# Patient Record
Sex: Male | Born: 1935 | Race: Black or African American | Hispanic: No | Marital: Married | Smoking: Former smoker
Health system: Southern US, Community
[De-identification: ages and names within clinical notes are randomized; demographics above are authoritative.]

## PROBLEM LIST (undated history)

## (undated) DIAGNOSIS — E785 Hyperlipidemia, unspecified: Secondary | ICD-10-CM

## (undated) DIAGNOSIS — M1990 Unspecified osteoarthritis, unspecified site: Secondary | ICD-10-CM

## (undated) DIAGNOSIS — C61 Malignant neoplasm of prostate: Secondary | ICD-10-CM

## (undated) DIAGNOSIS — Z973 Presence of spectacles and contact lenses: Secondary | ICD-10-CM

## (undated) DIAGNOSIS — I10 Essential (primary) hypertension: Secondary | ICD-10-CM

## (undated) DIAGNOSIS — Z9189 Other specified personal risk factors, not elsewhere classified: Secondary | ICD-10-CM

## (undated) DIAGNOSIS — D509 Iron deficiency anemia, unspecified: Secondary | ICD-10-CM

## (undated) DIAGNOSIS — C221 Intrahepatic bile duct carcinoma: Secondary | ICD-10-CM

## (undated) DIAGNOSIS — E119 Type 2 diabetes mellitus without complications: Secondary | ICD-10-CM

## (undated) DIAGNOSIS — K219 Gastro-esophageal reflux disease without esophagitis: Secondary | ICD-10-CM

## (undated) DIAGNOSIS — R7303 Prediabetes: Secondary | ICD-10-CM

## (undated) HISTORY — PX: APPENDECTOMY: SHX54

## (undated) HISTORY — DX: Intrahepatic bile duct carcinoma: C22.1

## (undated) HISTORY — DX: Malignant neoplasm of prostate: C61

## (undated) HISTORY — DX: Iron deficiency anemia, unspecified: D50.9

---

## 2007-06-01 HISTORY — PX: COLONOSCOPY W/ BIOPSIES: SHX1374

## 2011-07-13 DIAGNOSIS — E119 Type 2 diabetes mellitus without complications: Secondary | ICD-10-CM | POA: Diagnosis not present

## 2011-11-01 DIAGNOSIS — E1110 Type 2 diabetes mellitus with ketoacidosis without coma: Secondary | ICD-10-CM | POA: Diagnosis not present

## 2011-11-01 DIAGNOSIS — F528 Other sexual dysfunction not due to a substance or known physiological condition: Secondary | ICD-10-CM | POA: Diagnosis not present

## 2011-11-01 DIAGNOSIS — E782 Mixed hyperlipidemia: Secondary | ICD-10-CM | POA: Diagnosis not present

## 2011-11-01 DIAGNOSIS — E119 Type 2 diabetes mellitus without complications: Secondary | ICD-10-CM | POA: Diagnosis not present

## 2011-11-01 DIAGNOSIS — I10 Essential (primary) hypertension: Secondary | ICD-10-CM | POA: Diagnosis not present

## 2011-11-01 DIAGNOSIS — Z79899 Other long term (current) drug therapy: Secondary | ICD-10-CM | POA: Diagnosis not present

## 2011-11-01 DIAGNOSIS — E7800 Pure hypercholesterolemia, unspecified: Secondary | ICD-10-CM | POA: Diagnosis not present

## 2012-03-31 DIAGNOSIS — I10 Essential (primary) hypertension: Secondary | ICD-10-CM | POA: Diagnosis not present

## 2012-03-31 DIAGNOSIS — Z125 Encounter for screening for malignant neoplasm of prostate: Secondary | ICD-10-CM | POA: Diagnosis not present

## 2012-03-31 DIAGNOSIS — E7800 Pure hypercholesterolemia, unspecified: Secondary | ICD-10-CM | POA: Diagnosis not present

## 2012-03-31 DIAGNOSIS — Z23 Encounter for immunization: Secondary | ICD-10-CM | POA: Diagnosis not present

## 2012-03-31 DIAGNOSIS — E119 Type 2 diabetes mellitus without complications: Secondary | ICD-10-CM | POA: Diagnosis not present

## 2012-04-06 DIAGNOSIS — E1139 Type 2 diabetes mellitus with other diabetic ophthalmic complication: Secondary | ICD-10-CM | POA: Diagnosis not present

## 2012-07-28 DIAGNOSIS — E119 Type 2 diabetes mellitus without complications: Secondary | ICD-10-CM | POA: Diagnosis not present

## 2012-07-28 DIAGNOSIS — I10 Essential (primary) hypertension: Secondary | ICD-10-CM | POA: Diagnosis not present

## 2012-07-28 DIAGNOSIS — E781 Pure hyperglyceridemia: Secondary | ICD-10-CM | POA: Diagnosis not present

## 2012-07-28 DIAGNOSIS — E7800 Pure hypercholesterolemia, unspecified: Secondary | ICD-10-CM | POA: Diagnosis not present

## 2012-11-28 DIAGNOSIS — I10 Essential (primary) hypertension: Secondary | ICD-10-CM | POA: Diagnosis not present

## 2012-11-28 DIAGNOSIS — E785 Hyperlipidemia, unspecified: Secondary | ICD-10-CM | POA: Diagnosis not present

## 2012-11-28 DIAGNOSIS — E781 Pure hyperglyceridemia: Secondary | ICD-10-CM | POA: Diagnosis not present

## 2012-11-28 DIAGNOSIS — E119 Type 2 diabetes mellitus without complications: Secondary | ICD-10-CM | POA: Diagnosis not present

## 2012-12-14 DIAGNOSIS — E876 Hypokalemia: Secondary | ICD-10-CM | POA: Diagnosis not present

## 2012-12-19 DIAGNOSIS — Z0000 Encounter for general adult medical examination without abnormal findings: Secondary | ICD-10-CM | POA: Diagnosis not present

## 2013-03-28 DIAGNOSIS — I10 Essential (primary) hypertension: Secondary | ICD-10-CM | POA: Diagnosis not present

## 2013-03-28 DIAGNOSIS — E119 Type 2 diabetes mellitus without complications: Secondary | ICD-10-CM | POA: Diagnosis not present

## 2013-03-28 DIAGNOSIS — R972 Elevated prostate specific antigen [PSA]: Secondary | ICD-10-CM | POA: Diagnosis not present

## 2013-03-28 DIAGNOSIS — Z125 Encounter for screening for malignant neoplasm of prostate: Secondary | ICD-10-CM | POA: Diagnosis not present

## 2013-04-11 DIAGNOSIS — I10 Essential (primary) hypertension: Secondary | ICD-10-CM | POA: Diagnosis not present

## 2013-08-07 DIAGNOSIS — E781 Pure hyperglyceridemia: Secondary | ICD-10-CM | POA: Diagnosis not present

## 2013-08-07 DIAGNOSIS — E7800 Pure hypercholesterolemia, unspecified: Secondary | ICD-10-CM | POA: Diagnosis not present

## 2013-08-07 DIAGNOSIS — I10 Essential (primary) hypertension: Secondary | ICD-10-CM | POA: Diagnosis not present

## 2013-08-07 DIAGNOSIS — E119 Type 2 diabetes mellitus without complications: Secondary | ICD-10-CM | POA: Diagnosis not present

## 2013-09-25 DIAGNOSIS — N402 Nodular prostate without lower urinary tract symptoms: Secondary | ICD-10-CM | POA: Diagnosis not present

## 2013-09-25 DIAGNOSIS — R972 Elevated prostate specific antigen [PSA]: Secondary | ICD-10-CM | POA: Diagnosis not present

## 2013-11-05 DIAGNOSIS — C61 Malignant neoplasm of prostate: Secondary | ICD-10-CM | POA: Diagnosis not present

## 2013-11-05 DIAGNOSIS — R972 Elevated prostate specific antigen [PSA]: Secondary | ICD-10-CM | POA: Diagnosis not present

## 2013-11-05 DIAGNOSIS — D075 Carcinoma in situ of prostate: Secondary | ICD-10-CM | POA: Diagnosis not present

## 2013-11-05 HISTORY — PX: PROSTATE BIOPSY: SHX241

## 2013-11-13 DIAGNOSIS — C61 Malignant neoplasm of prostate: Secondary | ICD-10-CM | POA: Diagnosis not present

## 2013-11-27 ENCOUNTER — Encounter: Payer: Self-pay | Admitting: Radiation Oncology

## 2013-11-27 NOTE — Progress Notes (Signed)
GU Location of Tumor / Histology: prostate adenocarcinoma  If Prostate Cancer, Gleason Score is (3 + 3) and PSA is (3.91 on 08/08/13)  Bruce Duarte presented 2 months ago with signs/symptoms of: rising PSA  Biopsies of prostate (if applicable) revealed:  07/04/56   Past/Anticipated interventions by urology, if any: biopsy volume=42.34cc  Past/Anticipated interventions by medical oncology, if any: none  Weight changes, if any: none  Bowel/Bladder complaints, if any: no  Regular bowel movements with  stool softener daily, no bladder difficulty's,no dysuria,hematuria,   Nausea/Vomiting, if any: no  Pain issues, if any:  None no  SAFETY ISSUES:  Prior radiation? no  Pacemaker/ICD? no  Possible current pregnancy? na  Is the patient on methotrexate? no  Current Complaints / other details:  Married, retired, cousin w/prostate cancer Interested in seed implant.

## 2013-12-04 ENCOUNTER — Ambulatory Visit
Admission: RE | Admit: 2013-12-04 | Discharge: 2013-12-04 | Disposition: A | Discharge status: Home / Self Care | Payer: Medicare Other | Source: Ambulatory Visit | Attending: Radiation Oncology | Admitting: Radiation Oncology

## 2013-12-04 ENCOUNTER — Encounter: Payer: Self-pay | Admitting: Radiation Oncology

## 2013-12-04 VITALS — BP 158/68 | HR 87 | Temp 97.8°F | Resp 20 | Ht 69.0 in | Wt 221.1 lb

## 2013-12-04 DIAGNOSIS — Z510 Encounter for antineoplastic radiation therapy: Secondary | ICD-10-CM | POA: Diagnosis not present

## 2013-12-04 DIAGNOSIS — E7800 Pure hypercholesterolemia, unspecified: Secondary | ICD-10-CM | POA: Insufficient documentation

## 2013-12-04 DIAGNOSIS — C61 Malignant neoplasm of prostate: Secondary | ICD-10-CM | POA: Insufficient documentation

## 2013-12-04 DIAGNOSIS — Z87891 Personal history of nicotine dependence: Secondary | ICD-10-CM | POA: Insufficient documentation

## 2013-12-04 DIAGNOSIS — N529 Male erectile dysfunction, unspecified: Secondary | ICD-10-CM | POA: Insufficient documentation

## 2013-12-04 DIAGNOSIS — E119 Type 2 diabetes mellitus without complications: Secondary | ICD-10-CM | POA: Insufficient documentation

## 2013-12-04 DIAGNOSIS — I10 Essential (primary) hypertension: Secondary | ICD-10-CM | POA: Insufficient documentation

## 2013-12-04 DIAGNOSIS — Z7982 Long term (current) use of aspirin: Secondary | ICD-10-CM | POA: Diagnosis not present

## 2013-12-04 HISTORY — DX: Type 2 diabetes mellitus without complications: E11.9

## 2013-12-04 HISTORY — DX: Essential (primary) hypertension: I10

## 2013-12-04 HISTORY — DX: Malignant neoplasm of prostate: C61

## 2013-12-04 NOTE — Progress Notes (Signed)
[facility] NEW PATIENT EVALUATION  Name: Bruce Duarte MRN: [id number]  Date:   12/04/2013           DOB: May 28, 1936  Status: outpatient   CC:   Arvil Persons, MD Dr. [name]   REFERRING PHYSICIAN: Janice Norrie Charlene Brooke, MD   DIAGNOSIS: Stage TI C. favorable risk adenocarcinoma prostate   HISTORY OF PRESENT ILLNESS:  Bruce Duarte is a 78 y.o. male who is seen today through the courtesy of Dr. Janice Norrie for evaluation of his stage TI C. favorable risk adenocarcinoma prostate. He was found to have a PSA of 2.75 back in November 2013. A more recent PSA by Dr. Criss Rosales on 08/08/2013 was 3.91. He was seen by Dr. Janice Norrie who performed ultrasound-guided biopsies on 11/05/2013 and he was found have Gleason 6 (3+3) involving 30% of one core from right lateral apex and 20% of one core from the right apex. His gland volume was 42.3 cc. He is doing well from a GU and GI standpoint. His I PSS score is 0. He does have erectile dysfunction.  PREVIOUS RADIATION THERAPY: No   PAST MEDICAL HISTORY:  has a past medical history of Prostate cancer (11/05/13); Diabetes mellitus without complication; Hypertension; Hypercholesteremia; and Irregular heart rhythm.     PAST SURGICAL HISTORY:  Past Surgical History  Procedure Laterality Date  . Prostate biopsy  11/05/13    adenocarcinoma, gleason 6  . Appendectomy       FAMILY HISTORY: family history includes Cancer in his cousin; Diabetes in his mother; Stroke in his father. His father died of a stroke at 74. His mother died from "brain cancer" at 66. Family history remarkable for prostate cancer in a distant cousin.   SOCIAL HISTORY:  reports that he quit smoking about 40 years ago. His smoking use included Cigarettes, Pipe, and Cigars. He has a 25 pack-year smoking history. He has never used smokeless tobacco. He reports that he drinks alcohol. He reports that he does not use illicit drugs. 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 3  of 5 children are living.   ALLERGIES: Review of patient's allergies indicates no known allergies.   MEDICATIONS:  Current Outpatient Prescriptions  Medication Sig Dispense Refill  . ALOE VERA JUICE LIQD Take 120 mLs by mouth daily.      Marland Kitchen aspirin 81 MG tablet Take 81 mg by mouth daily.      Mariane Baumgarten Calcium (STOOL SOFTENER PO) Take by mouth.      . ezetimibe (ZETIA) 10 MG tablet Take 10 mg by mouth daily.      Marland Kitchen losartan-hydrochlorothiazide (HYZAAR) 100-25 MG per tablet Take 1 tablet by mouth daily.      . metFORMIN (GLUCOPHAGE) 1000 MG tablet Take 1,000 mg by mouth 2 (two) times daily with a meal.      . Misc Natural Products (GINSENG COMPLEX PO) Take 2 capsules by mouth daily.      . Multiple Vitamins-Minerals (MULTIVITAMIN GUMMIES MENS PO) Take 1 tablet by mouth daily.       No current facility-administered medications for this encounter.     REVIEW OF SYSTEMS:  Pertinent items are noted in HPI.    PHYSICAL EXAM:  height is 5\' 9"  (1.753 m) and weight is 221 lb 1.6 oz (100.29 kg). His oral temperature is 97.8 F (36.6 C). His blood pressure is 158/68 and his pulse is 87. His respiration is 20.   Alert and  oriented. Head and neck examination: Grossly unremarkable. Nodes: Without palpable cervical or supraclavicular lymphadenopathy. Chest: Lungs clear. Back: Without spinal or CVA tenderness. Abdomen: Without masses organomegaly. Genitalia: Unremarkable to inspection. Rectal: The prostate gland is normal size it is without focal induration or nodularity. Extremities: Without edema.   LABORATORY DATA:  No results found for this basename: WBC, HGB, HCT, MCV, PLT   No results found for this basename: NA, K, CL, CO2   No results found for this basename: ALT, AST, GGT, ALKPHOS, BILITOT   PSA 3.91 from 08/08/2013   IMPRESSION: Stage TI C. favorable risk adenocarcinoma prostate. I explained to the patient and his wife that his prognosis is related to his stage, PSA level, and  Gleason score. All are favorable. Management options include surgery versus close surveillance/observation, vs. radiation therapy. Radiation therapy options include seed implantation alone or 8 weeks of external beam/IMRT. We discussed the potential acute and late toxicities of radiation therapy. We also spent a good time discussing close surveillance/observation, which I think would be a good choice for him considering his age. We discussed the process of close surveillance/observation. I explained to the patient and his wife that he is more likely to die from a cardiac event, than from prostate cancer. He indicated that he is very interested in seed implantation, and really wants to consider treating his disease at this time. I told him that the next step would be for [place] to obtain a CT arch study. He is willing to go home and review the literature that he is given today, and then call me if he wants to proceed with seed implantation.   PLAN: As discussed above.  I spent 45  minutes face to face with the patient and more than 50% of that time was spent in counseling and/or coordination of care.

## 2013-12-04 NOTE — Progress Notes (Signed)
Please see the Nurse Progress Note in the MD Initial Consult Encounter for this patient. 

## 2013-12-05 DIAGNOSIS — C61 Malignant neoplasm of prostate: Secondary | ICD-10-CM | POA: Diagnosis not present

## 2013-12-05 DIAGNOSIS — I10 Essential (primary) hypertension: Secondary | ICD-10-CM | POA: Diagnosis not present

## 2013-12-05 DIAGNOSIS — E119 Type 2 diabetes mellitus without complications: Secondary | ICD-10-CM | POA: Diagnosis not present

## 2013-12-05 DIAGNOSIS — E781 Pure hyperglyceridemia: Secondary | ICD-10-CM | POA: Diagnosis not present

## 2013-12-05 DIAGNOSIS — E7800 Pure hypercholesterolemia, unspecified: Secondary | ICD-10-CM | POA: Diagnosis not present

## 2013-12-10 ENCOUNTER — Encounter: Payer: Self-pay | Admitting: Radiation Oncology

## 2013-12-10 NOTE — Progress Notes (Signed)
CC: Dr. Lowella Bandy  Mr. Cuffe called me today and he wants to proceed with seed implantation. We will have him visit this week for his CT arch study and then get him scheduled for seed implantation with Dr. Janice Norrie.

## 2013-12-12 ENCOUNTER — Telehealth: Payer: Self-pay | Admitting: *Deleted

## 2013-12-12 ENCOUNTER — Ambulatory Visit
Admission: RE | Admit: 2013-12-12 | Discharge: 2013-12-12 | Disposition: A | Discharge status: Home / Self Care | Payer: Medicare Other | Source: Ambulatory Visit | Attending: Radiation Oncology | Admitting: Radiation Oncology

## 2013-12-12 DIAGNOSIS — I10 Essential (primary) hypertension: Secondary | ICD-10-CM | POA: Diagnosis not present

## 2013-12-12 DIAGNOSIS — Z510 Encounter for antineoplastic radiation therapy: Secondary | ICD-10-CM | POA: Diagnosis not present

## 2013-12-12 DIAGNOSIS — C61 Malignant neoplasm of prostate: Secondary | ICD-10-CM | POA: Diagnosis not present

## 2013-12-12 DIAGNOSIS — N529 Male erectile dysfunction, unspecified: Secondary | ICD-10-CM | POA: Diagnosis not present

## 2013-12-12 DIAGNOSIS — E119 Type 2 diabetes mellitus without complications: Secondary | ICD-10-CM | POA: Diagnosis not present

## 2013-12-12 DIAGNOSIS — E7800 Pure hypercholesterolemia, unspecified: Secondary | ICD-10-CM | POA: Diagnosis not present

## 2013-12-12 NOTE — Telephone Encounter (Signed)
Called patient and lvm for a return call 

## 2013-12-12 NOTE — Progress Notes (Signed)
CC: Dr. Lowella Bandy    Complex simulation/treatment planning note: The patient was taken to the CT simulator. His pelvis was scanned. The CT data set was sent to the planning system I contoured his prostate. The prostate was projected over the pubic arch. His prostate volume measured to be 39.0 cc. Dr. Janice Norrie measured a volume of 42.3 cc, a good correlation. His arch is open. He is a candidate for seed implantation. I prescribing 14,500 cGy utilizing I-125 seeds. He will be implanted with the [employer] system.

## 2013-12-13 ENCOUNTER — Telehealth: Payer: Self-pay | Admitting: *Deleted

## 2013-12-13 ENCOUNTER — Other Ambulatory Visit: Payer: Self-pay | Admitting: Urology

## 2013-12-13 NOTE — Telephone Encounter (Signed)
CALLED PATIENT TO INFORM OF IMPLANT DATE, SPOKE WITH PATIENT AND HE IS AWARE OF THIS DATE 

## 2013-12-14 ENCOUNTER — Telehealth: Payer: Self-pay | Admitting: *Deleted

## 2013-12-14 NOTE — Telephone Encounter (Signed)
CALLED PATIENT TO REMIND TO GET CHEST X-RAY AND EKG, LVM FOR A RETURN CALL

## 2013-12-17 ENCOUNTER — Ambulatory Visit (HOSPITAL_BASED_OUTPATIENT_CLINIC_OR_DEPARTMENT_OTHER)
Admission: RE | Admit: 2013-12-17 | Discharge: 2013-12-17 | Disposition: A | Discharge status: Home / Self Care | Payer: Medicare Other | Source: Ambulatory Visit | Attending: Urology | Admitting: Urology

## 2013-12-17 ENCOUNTER — Encounter (HOSPITAL_BASED_OUTPATIENT_CLINIC_OR_DEPARTMENT_OTHER)
Admission: RE | Admit: 2013-12-17 | Discharge: 2013-12-17 | Disposition: A | Discharge status: Home / Self Care | Payer: Medicare Other | Source: Ambulatory Visit | Attending: Urology | Admitting: Urology

## 2013-12-17 ENCOUNTER — Other Ambulatory Visit: Payer: Self-pay

## 2013-12-17 DIAGNOSIS — E119 Type 2 diabetes mellitus without complications: Secondary | ICD-10-CM | POA: Diagnosis not present

## 2013-12-17 DIAGNOSIS — Z01818 Encounter for other preprocedural examination: Secondary | ICD-10-CM | POA: Diagnosis not present

## 2013-12-17 DIAGNOSIS — I700 Atherosclerosis of aorta: Secondary | ICD-10-CM | POA: Diagnosis not present

## 2013-12-17 DIAGNOSIS — I10 Essential (primary) hypertension: Secondary | ICD-10-CM | POA: Diagnosis not present

## 2014-01-21 ENCOUNTER — Telehealth: Payer: Self-pay | Admitting: *Deleted

## 2014-01-21 ENCOUNTER — Encounter (HOSPITAL_BASED_OUTPATIENT_CLINIC_OR_DEPARTMENT_OTHER): Payer: Self-pay | Admitting: *Deleted

## 2014-01-21 NOTE — Telephone Encounter (Signed)
CALLED PATIENT TO REMIND OF LABS FOR 01-22-14, LVM FOR A RETURN CALL

## 2014-01-22 ENCOUNTER — Encounter (HOSPITAL_BASED_OUTPATIENT_CLINIC_OR_DEPARTMENT_OTHER): Payer: Self-pay | Admitting: *Deleted

## 2014-01-22 DIAGNOSIS — M129 Arthropathy, unspecified: Secondary | ICD-10-CM | POA: Diagnosis not present

## 2014-01-22 DIAGNOSIS — E785 Hyperlipidemia, unspecified: Secondary | ICD-10-CM | POA: Diagnosis not present

## 2014-01-22 DIAGNOSIS — Z87891 Personal history of nicotine dependence: Secondary | ICD-10-CM | POA: Diagnosis not present

## 2014-01-22 DIAGNOSIS — Z8042 Family history of malignant neoplasm of prostate: Secondary | ICD-10-CM | POA: Diagnosis not present

## 2014-01-22 DIAGNOSIS — I10 Essential (primary) hypertension: Secondary | ICD-10-CM | POA: Diagnosis not present

## 2014-01-22 DIAGNOSIS — Z9089 Acquired absence of other organs: Secondary | ICD-10-CM | POA: Diagnosis not present

## 2014-01-22 DIAGNOSIS — C61 Malignant neoplasm of prostate: Secondary | ICD-10-CM | POA: Diagnosis not present

## 2014-01-22 DIAGNOSIS — Z79899 Other long term (current) drug therapy: Secondary | ICD-10-CM | POA: Diagnosis not present

## 2014-01-22 LAB — CBC
HCT: 40.2 % (ref 39.0–52.0)
HEMOGLOBIN: 12.8 g/dL — AB (ref 13.0–17.0)
MCH: 24.5 pg — ABNORMAL LOW (ref 26.0–34.0)
MCHC: 31.8 g/dL (ref 30.0–36.0)
MCV: 76.9 fL — ABNORMAL LOW (ref 78.0–100.0)
Platelets: 276 10*3/uL (ref 150–400)
RBC: 5.23 MIL/uL (ref 4.22–5.81)
RDW: 18.6 % — ABNORMAL HIGH (ref 11.5–15.5)
WBC: 3.3 10*3/uL — ABNORMAL LOW (ref 4.0–10.5)

## 2014-01-22 LAB — COMPREHENSIVE METABOLIC PANEL
ALT: 20 U/L (ref 0–53)
AST: 21 U/L (ref 0–37)
Albumin: 3.9 g/dL (ref 3.5–5.2)
Alkaline Phosphatase: 59 U/L (ref 39–117)
Anion gap: 14 (ref 5–15)
BUN: 15 mg/dL (ref 6–23)
CALCIUM: 9.6 mg/dL (ref 8.4–10.5)
CO2: 25 mEq/L (ref 19–32)
Chloride: 102 mEq/L (ref 96–112)
Creatinine, Ser: 0.99 mg/dL (ref 0.50–1.35)
GFR calc non Af Amer: 78 mL/min — ABNORMAL LOW (ref >90)
GLUCOSE: 113 mg/dL — AB (ref 70–99)
Potassium: 3.8 mEq/L (ref 3.7–5.3)
SODIUM: 141 meq/L (ref 137–147)
TOTAL PROTEIN: 7.6 g/dL (ref 6.0–8.3)
Total Bilirubin: 0.3 mg/dL (ref 0.3–1.2)

## 2014-01-22 LAB — PROTIME-INR
INR: 1 (ref 0.00–1.49)
Prothrombin Time: 13.2 seconds (ref 11.6–15.2)

## 2014-01-22 LAB — APTT: APTT: 30 s (ref 24–37)

## 2014-01-22 NOTE — Progress Notes (Signed)
NPO AFTER MN. ARRIVE AT 0830. CURRENT LAB RESULTS, EKG AND CXR IN CHART AND EPIC. WILL DO FLEET ENEMA AM DOS.

## 2014-01-22 NOTE — Progress Notes (Signed)
01/22/14 1526  OBSTRUCTIVE SLEEP APNEA  Have you ever been diagnosed with sleep apnea through a sleep study? No  Do you snore loudly (loud enough to be heard through closed doors)?  1  Do you often feel tired, fatigued, or sleepy during the daytime? 0  Has anyone observed you stop breathing during your sleep? 0  Do you have, or are you being treated for high blood pressure? 1  BMI more than 35 kg/m2? 0  Age over 76 years old? 1  Neck circumference greater than 40 cm/16 inches? 1  Gender: 1  Obstructive Sleep Apnea Score 5  Score 4 or greater  Results sent to PCP

## 2014-01-28 ENCOUNTER — Telehealth: Payer: Self-pay | Admitting: *Deleted

## 2014-01-28 DIAGNOSIS — C61 Malignant neoplasm of prostate: Secondary | ICD-10-CM | POA: Diagnosis not present

## 2014-01-28 NOTE — Telephone Encounter (Signed)
CALLED PATIENT TO REMIND OF PROCEDURE FOR 01-29-14, SPOKE WITH PATIENT AND HE IS AWARE OF THIS PROCEDURE

## 2014-01-29 ENCOUNTER — Encounter (HOSPITAL_BASED_OUTPATIENT_CLINIC_OR_DEPARTMENT_OTHER): Payer: Self-pay | Admitting: Certified Registered"

## 2014-01-29 ENCOUNTER — Encounter (HOSPITAL_BASED_OUTPATIENT_CLINIC_OR_DEPARTMENT_OTHER)
Admission: RE | Disposition: A | Discharge status: Home / Self Care | Payer: Self-pay | Source: Ambulatory Visit | Attending: Urology

## 2014-01-29 ENCOUNTER — Encounter: Payer: Self-pay | Admitting: Radiation Oncology

## 2014-01-29 ENCOUNTER — Ambulatory Visit (HOSPITAL_COMMUNITY): Payer: Medicare Other

## 2014-01-29 ENCOUNTER — Ambulatory Visit (HOSPITAL_BASED_OUTPATIENT_CLINIC_OR_DEPARTMENT_OTHER)
Admission: RE | Admit: 2014-01-29 | Discharge: 2014-01-29 | Disposition: A | Discharge status: Home / Self Care | Payer: Medicare Other | Source: Ambulatory Visit | Attending: Urology | Admitting: Urology

## 2014-01-29 ENCOUNTER — Encounter (HOSPITAL_BASED_OUTPATIENT_CLINIC_OR_DEPARTMENT_OTHER): Payer: Medicare Other | Admitting: Anesthesiology

## 2014-01-29 ENCOUNTER — Ambulatory Visit (HOSPITAL_BASED_OUTPATIENT_CLINIC_OR_DEPARTMENT_OTHER): Payer: Medicare Other | Admitting: Anesthesiology

## 2014-01-29 DIAGNOSIS — E785 Hyperlipidemia, unspecified: Secondary | ICD-10-CM | POA: Diagnosis not present

## 2014-01-29 DIAGNOSIS — M129 Arthropathy, unspecified: Secondary | ICD-10-CM | POA: Insufficient documentation

## 2014-01-29 DIAGNOSIS — Z9089 Acquired absence of other organs: Secondary | ICD-10-CM | POA: Insufficient documentation

## 2014-01-29 DIAGNOSIS — C61 Malignant neoplasm of prostate: Secondary | ICD-10-CM | POA: Insufficient documentation

## 2014-01-29 DIAGNOSIS — I10 Essential (primary) hypertension: Secondary | ICD-10-CM | POA: Insufficient documentation

## 2014-01-29 DIAGNOSIS — Z79899 Other long term (current) drug therapy: Secondary | ICD-10-CM | POA: Diagnosis not present

## 2014-01-29 DIAGNOSIS — Z8042 Family history of malignant neoplasm of prostate: Secondary | ICD-10-CM | POA: Insufficient documentation

## 2014-01-29 DIAGNOSIS — Z87891 Personal history of nicotine dependence: Secondary | ICD-10-CM | POA: Insufficient documentation

## 2014-01-29 HISTORY — DX: Unspecified osteoarthritis, unspecified site: M19.90

## 2014-01-29 HISTORY — DX: Presence of spectacles and contact lenses: Z97.3

## 2014-01-29 HISTORY — DX: Hyperlipidemia, unspecified: E78.5

## 2014-01-29 HISTORY — DX: Prediabetes: R73.03

## 2014-01-29 HISTORY — DX: Other specified personal risk factors, not elsewhere classified: Z91.89

## 2014-01-29 HISTORY — PX: RADIOACTIVE SEED IMPLANT: SHX5150

## 2014-01-29 LAB — POCT I-STAT 4, (NA,K, GLUC, HGB,HCT)
Glucose, Bld: 108 mg/dL — ABNORMAL HIGH (ref 70–99)
HCT: 40 % (ref 39.0–52.0)
HEMOGLOBIN: 13.6 g/dL (ref 13.0–17.0)
Potassium: 3.8 mEq/L (ref 3.7–5.3)
Sodium: 140 mEq/L (ref 137–147)

## 2014-01-29 LAB — GLUCOSE, CAPILLARY: GLUCOSE-CAPILLARY: 112 mg/dL — AB (ref 70–99)

## 2014-01-29 SURGERY — INSERTION, RADIATION SOURCE, PROSTATE
Anesthesia: General | Site: Prostate

## 2014-01-29 MED ORDER — HYDROCODONE-ACETAMINOPHEN 5-325 MG PO TABS: 1.0000 | ORAL_TABLET | Freq: Four times a day (QID) | ORAL | Status: DC | PRN

## 2014-01-29 MED ORDER — MEPERIDINE HCL 25 MG/ML IJ SOLN
6.2500 mg | INTRAMUSCULAR | Status: DC | PRN
  Filled 2014-01-29: qty 1

## 2014-01-29 MED ORDER — FENTANYL CITRATE 0.05 MG/ML IJ SOLN
25.0000 ug | INTRAMUSCULAR | Status: DC | PRN
  Filled 2014-01-29: qty 1

## 2014-01-29 MED ORDER — CIPROFLOXACIN IN D5W 400 MG/200ML IV SOLN
400.0000 mg | INTRAVENOUS | Status: AC
  Administered 2014-01-29: 400 mg via INTRAVENOUS
  Filled 2014-01-29: qty 200

## 2014-01-29 MED ORDER — IOHEXOL 350 MG/ML SOLN
INTRAVENOUS | Status: DC | PRN
Start: 2014-01-29 — End: 2014-01-29
  Administered 2014-01-29: 7 mL

## 2014-01-29 MED ORDER — PROMETHAZINE HCL 25 MG/ML IJ SOLN
6.2500 mg | INTRAMUSCULAR | Status: DC | PRN
  Filled 2014-01-29: qty 1

## 2014-01-29 MED ORDER — LIDOCAINE HCL (CARDIAC) 20 MG/ML IV SOLN
INTRAVENOUS | Status: DC | PRN
  Administered 2014-01-29: 60 mg via INTRAVENOUS

## 2014-01-29 MED ORDER — SUCCINYLCHOLINE CHLORIDE 20 MG/ML IJ SOLN
INTRAMUSCULAR | Status: DC | PRN
  Administered 2014-01-29: 100 mg via INTRAVENOUS

## 2014-01-29 MED ORDER — FENTANYL CITRATE 0.05 MG/ML IJ SOLN
INTRAMUSCULAR | Status: DC | PRN
  Administered 2014-01-29 (×3): 25 ug via INTRAVENOUS
  Administered 2014-01-29: 50 ug via INTRAVENOUS
  Administered 2014-01-29: 25 ug via INTRAVENOUS
  Administered 2014-01-29: 50 ug via INTRAVENOUS

## 2014-01-29 MED ORDER — LACTATED RINGERS IV SOLN
INTRAVENOUS | Status: DC
  Administered 2014-01-29 (×2): via INTRAVENOUS
  Filled 2014-01-29: qty 1000

## 2014-01-29 MED ORDER — PROPOFOL 10 MG/ML IV BOLUS
INTRAVENOUS | Status: DC | PRN
  Administered 2014-01-29: 180 mg via INTRAVENOUS

## 2014-01-29 MED ORDER — KETOROLAC TROMETHAMINE 30 MG/ML IJ SOLN
INTRAMUSCULAR | Status: DC | PRN
  Administered 2014-01-29: 30 mg via INTRAVENOUS

## 2014-01-29 MED ORDER — ONDANSETRON HCL 4 MG/2ML IJ SOLN
INTRAMUSCULAR | Status: DC | PRN
  Administered 2014-01-29: 4 mg via INTRAVENOUS

## 2014-01-29 MED ORDER — CIPROFLOXACIN HCL 500 MG PO TABS: 500.0000 mg | ORAL_TABLET | Freq: Two times a day (BID) | ORAL | Status: DC

## 2014-01-29 MED ORDER — STERILE WATER FOR IRRIGATION IR SOLN
Status: DC | PRN
  Administered 2014-01-29: 3000 mL

## 2014-01-29 MED ORDER — FENTANYL CITRATE 0.05 MG/ML IJ SOLN
INTRAMUSCULAR | Status: AC
  Filled 2014-01-29: qty 6

## 2014-01-29 MED ORDER — CIPROFLOXACIN IN D5W 400 MG/200ML IV SOLN
INTRAVENOUS | Status: AC
  Filled 2014-01-29: qty 200

## 2014-01-29 MED ORDER — FLEET ENEMA 7-19 GM/118ML RE ENEM
1.0000 | ENEMA | Freq: Once | RECTAL | Status: DC
  Filled 2014-01-29: qty 1

## 2014-01-29 MED ORDER — DEXAMETHASONE SODIUM PHOSPHATE 4 MG/ML IJ SOLN
INTRAMUSCULAR | Status: DC | PRN
  Administered 2014-01-29: 8 mg via INTRAVENOUS

## 2014-01-29 SURGICAL SUPPLY — 26 items
BAG URINE DRAINAGE (UROLOGICAL SUPPLIES) ×3 IMPLANT
BLADE CLIPPER SURG (BLADE) ×3 IMPLANT
CATH FOLEY 2WAY SLVR  5CC 16FR (CATHETERS) ×4
CATH FOLEY 2WAY SLVR 5CC 16FR (CATHETERS) ×2 IMPLANT
CATH ROBINSON RED A/P 20FR (CATHETERS) ×3 IMPLANT
CLOTH BEACON ORANGE TIMEOUT ST (SAFETY) ×3 IMPLANT
COVER MAYO STAND STRL (DRAPES) ×3 IMPLANT
COVER TABLE BACK 60X90 (DRAPES) ×3 IMPLANT
DRSG TEGADERM 4X4.75 (GAUZE/BANDAGES/DRESSINGS) ×3 IMPLANT
DRSG TEGADERM 8X12 (GAUZE/BANDAGES/DRESSINGS) ×3 IMPLANT
GLOVE BIO SURGEON STRL SZ7 (GLOVE) ×6 IMPLANT
GLOVE BIO SURGEON STRL SZ7.5 (GLOVE) ×10 IMPLANT
GLOVE BIOGEL M 6.5 STRL (GLOVE) ×4 IMPLANT
GLOVE BIOGEL PI IND STRL 6.5 (GLOVE) IMPLANT
GLOVE BIOGEL PI INDICATOR 6.5 (GLOVE) ×4
GLOVE ECLIPSE 8.0 STRL XLNG CF (GLOVE) IMPLANT
GOWN STRL REUS W/TWL LRG LVL3 (GOWN DISPOSABLE) ×4 IMPLANT
GOWN STRL REUS W/TWL XL LVL3 (GOWN DISPOSABLE) ×2 IMPLANT
HOLDER FOLEY CATH W/STRAP (MISCELLANEOUS) ×3 IMPLANT
IV WATER IRRIG STERILE 3000ML (IV SOLUTION) ×3 IMPLANT
PACK CYSTOSCOPY (CUSTOM PROCEDURE TRAY) ×3 IMPLANT
SPONGE GAUZE 4X4 12PLY STER LF (GAUZE/BANDAGES/DRESSINGS) ×2 IMPLANT
SYRINGE 10CC LL (SYRINGE) ×3 IMPLANT
UNDERPAD 30X30 INCONTINENT (UNDERPADS AND DIAPERS) ×6 IMPLANT
WATER STERILE IRR 500ML POUR (IV SOLUTION) ×3 IMPLANT
nucletron selectseed ×144 IMPLANT

## 2014-01-29 NOTE — H&P (Signed)
Urology Admission H&P  Chief Complaint: Prostate cancer  History of Present Illness: Bruce Duarte was found on rectal examination to have a prostate nodule at the right mid gland.  His PSA is 3.91.  Prostate biopsy on 11/05/13  revealed  Gleason 6 prostate cancer.  Treatment options were discussed with the patient.  He is not interested in active surveillance although he was advised to consider it.  He saw Dr Valere Dross in consultation for radiation therapy.  He chose to have seeds implantation.  He is scheduled today for the procedure.  Past Medical History  Diagnosis Date  . Hypertension   . Hyperlipidemia   . Prostate cancer DX  2013    Gleason 3+3=6, vol 42.34 cc  . Borderline type 2 diabetes mellitus   . Arthritis   . Wears glasses   . At risk for sleep apnea     STOP-BANG= 5   SENT TO PCP 01-22-2014   Past Surgical History  Procedure Laterality Date  . Prostate biopsy  11/05/13    adenocarcinoma, gleason 6  . Appendectomy  age 55  . Colonoscopy w/ biopsies  2009    non-cancerous    Home Medications:  Prescriptions prior to admission  Medication Sig Dispense Refill  . ALOE VERA JUICE LIQD Take 120 mLs by mouth daily.      Marland Kitchen aspirin EC 81 MG tablet Take 81 mg by mouth daily.      . Cholecalciferol (VITAMIN D3) 1000 UNITS CAPS Take by mouth.      . docusate sodium (COLACE) 100 MG capsule Take 200 mg by mouth daily.      Marland Kitchen losartan-hydrochlorothiazide (HYZAAR) 100-25 MG per tablet Take 1 tablet by mouth every morning.       . metFORMIN (GLUMETZA) 500 MG (MOD) 24 hr tablet Take 1,500 mg by mouth every evening.      . Misc Natural Products (GINSENG COMPLEX PO) Take 2 capsules by mouth daily.      . Multiple Vitamins-Minerals (MULTIVITAMIN GUMMIES MENS PO) Take 1 tablet by mouth daily.      . vitamin E 400 UNIT capsule Take 400 Units by mouth daily.       Allergies: No Known Allergies  Family History  Problem Relation Age of Onset  . Diabetes Mother   . Stroke Father   . Cancer  Cousin     prostate   Social History:  reports that he quit smoking about 40 years ago. His smoking use included Cigarettes, Pipe, and Cigars. He has a 25 pack-year smoking history. He has never used smokeless tobacco. He reports that he drinks alcohol. He reports that he does not use illicit drugs.  ROS  Physical Exam:  Vital signs in last 24 hours: Temp:  [98 F (36.7 C)] 98 F (36.7 C) (09/01 0828) Pulse Rate:  [74] 74 (09/01 0828) Resp:  [14] 14 (09/01 0828) BP: (142)/(73) 142/73 mmHg (09/01 0828) SpO2:  [96 %] 96 % (09/01 0828) Weight:  [96.163 kg (212 lb)] 96.163 kg (212 lb) (09/01 0454) Physical Exam: HEENT: Normal Lungs: clear Heart: Regular sinus rhythm. Abdomen: Soft, non distended, non tender.  No CVA tenderness. Bowel sounds: normal Genitalia: Penis is normal.  Scrotum is unremarkable.  Testicles are normal. Rectal:  Sphincter tone: normal.  Prostate enlarged 40 gm.There is a prostate nodule at the right mid gland.  Seminal vesicles: not palpable.  Laboratory Data:  No results found for this or any previous visit (from the past 24 hour(s)). No  results found for this or any previous visit (from the past 240 hour(s)). Creatinine:  Recent Labs  01/22/14 0952  CREATININE 0.99     Impression/Assessment:  Adenocarcinoma of prostate Stage T2a  Plan:  I 125 seeds implantation.  Arvil Persons 01/29/2014, 9:11 AM

## 2014-01-29 NOTE — Op Note (Addendum)
Bruce Duarte is a 78 y.o.   01/29/2014  General  Preop diagnosis: Adenocarcinoma of prostate stage T 2a   Postop diagnosis: Same  Procedure done: I-125 seeds implantation, cystoscopy  Surgeons: Charlene Brooke. Aviv Lengacher and Dr. Arloa Koh  Anesthesia: Gen.  Indication: Patient is a 78 years old male who was found on rectal examination to have a prostate nodule at the right mid gland. His PSA is 3.91. Prostate biopsy showed Gleason 6 prostate cancer at the right apex. Treatment options were discussed with the patient. He is not interested in active surveillance. He saw Dr. Valere Dross in consultation. He elected to have seeds implantation. He is scheduled today for procedure.  Procedure: The patient was identified by his wrist band and proper timeout was taken.  Under general anesthesia he was prepped and draped and placed in the dorsolithotomy position. A #16 French Foley catheter was inserted in the bladder. Ultrasound planning was done by Dr. Valere Dross. When planning was completed on ultrasound guidance and with the Nucletron a total of 72 seeds through 27 needles were implanted in the prostate. The total apparent activity is 34.2000 mCi. With fluoroscopy there appears to be good seeds distribution.  The Foley catheter was removed. A flexible cystoscope was inserted in the bladder. The anterior urethra is normal. He has moderate prostatic hypertrophy. The bladder is moderately trabeculated. There is no stone or tumor in the bladder. The ureteral orifices are in normal position and shape with clear efflux. The cystoscope was then removed and.  A #16 French Foley catheter was reinserted in the bladder.  The patient tolerated the procedure well and left the OR in satisfactory condition to postanesthesia care unit.  CC: [name], M.D.

## 2014-01-29 NOTE — Anesthesia Postprocedure Evaluation (Signed)
  Anesthesia Post-op Note  Patient: Bruce Duarte  Procedure(s) Performed: Procedure(s) (LRB): RADIOACTIVE SEED IMPLANT (N/A)  Patient Location: PACU  Anesthesia Type: General  Level of Consciousness: awake and alert   Airway and Oxygen Therapy: Patient Spontanous Breathing  Post-op Pain: mild  Post-op Assessment: Post-op Vital signs reviewed, Patient's Cardiovascular Status Stable, Respiratory Function Stable, Patent Airway and No signs of Nausea or vomiting  Last Vitals:  Filed Vitals:   01/29/14 1235  BP: 163/78  Pulse: 80  Temp:   Resp: 11    Post-op Vital Signs: stable   Complications: No apparent anesthesia complications

## 2014-01-29 NOTE — Anesthesia Preprocedure Evaluation (Addendum)
Anesthesia Evaluation  Patient identified by MRN, date of birth, ID band Patient awake    Reviewed: Allergy & Precautions, H&P , NPO status , Patient's Chart, lab work & pertinent test results  Airway Mallampati: II TM Distance: >3 FB Neck ROM: Full    Dental no notable dental hx.    Pulmonary former smoker,  breath sounds clear to auscultation  Pulmonary exam normal       Cardiovascular hypertension, Pt. on medications Rhythm:Regular Rate:Normal     Neuro/Psych negative neurological ROS  negative psych ROS   GI/Hepatic negative GI ROS, Neg liver ROS,   Endo/Other  diabetes  Renal/GU negative Renal ROS  negative genitourinary   Musculoskeletal negative musculoskeletal ROS (+)   Abdominal   Peds negative pediatric ROS (+)  Hematology negative hematology ROS (+)   Anesthesia Other Findings   Reproductive/Obstetrics negative OB ROS                          Anesthesia Physical Anesthesia Plan  ASA: II  Anesthesia Plan: General   Post-op Pain Management:    Induction: Intravenous  Airway Management Planned: Oral ETT  Additional Equipment:   Intra-op Plan:   Post-operative Plan: Extubation in OR  Informed Consent: I have reviewed the patients History and Physical, chart, labs and discussed the procedure including the risks, benefits and alternatives for the proposed anesthesia with the patient or authorized representative who has indicated his/her understanding and acceptance.   Dental advisory given  Plan Discussed with: CRNA  Anesthesia Plan Comments:         Anesthesia Quick Evaluation

## 2014-01-29 NOTE — Transfer of Care (Signed)
Immediate Anesthesia Transfer of Care Note  Patient: Bruce Duarte  Procedure(s) Performed: Procedure(s) (LRB): RADIOACTIVE SEED IMPLANT (N/A)  Patient Location: PACU  Anesthesia Type: General  Level of Consciousness: awake, oriented, sedated and patient cooperative  Airway & Oxygen Therapy: Patient Spontanous Breathing and Patient connected to face mask oxygen  Post-op Assessment: Report given to PACU RN and Post -op Vital signs reviewed and stable  Post vital signs: Reviewed and stable  Complications: No apparent anesthesia complications

## 2014-01-29 NOTE — Progress Notes (Signed)
CC: Dr. Lowella Bandy  End of treatment summary:  Diagnosis: Stage TI C. favorable risk adenocarcinoma prostate  Requesting physician: Dr. Lowella Bandy  Intent: Curative  Procedure: Low dose rate brachytherapy prostate seed implant  Urologist: Dr. Lowella Bandy  Implant date: 01/29/2014  Site/dose: Prostate 14,500 cGy utilizing I-125 seeds. 25 active needles were used to implant 72 seeds with an individual seed activity of 0.48 mCi per seed for a total implant activity of 34.2 mCi.  Narrative: The patient appears to have undergone a successful Nucletron seed Selectron implant with Dr. Janice Norrie.  Plan: Followup visit with both of [place] in approximately 3 weeks. He'll have a CT scan at that time to assess the quality of his implant.

## 2014-01-29 NOTE — Anesthesia Procedure Notes (Signed)
Procedure Name: Intubation Date/Time: 01/29/2014 10:18 AM Performed by: Denna Haggard D Pre-anesthesia Checklist: Patient identified, Emergency Drugs available, Suction available and Patient being monitored Patient Re-evaluated:Patient Re-evaluated prior to inductionOxygen Delivery Method: Circle System Utilized Preoxygenation: Pre-oxygenation with 100% oxygen Intubation Type: IV induction Ventilation: Mask ventilation without difficulty Laryngoscope Size: Mac and 3 Grade View: Grade II Tube type: Oral Tube size: 8.0 mm Number of attempts: 2 Airway Equipment and Method: stylet and oral airway Placement Confirmation: ETT inserted through vocal cords under direct vision,  positive ETCO2 and breath sounds checked- equal and bilateral Secured at: 22 cm Tube secured with: Tape Dental Injury: Teeth and Oropharynx as per pre-operative assessment

## 2014-01-29 NOTE — Discharge Instructions (Signed)
Please instruct patient how to remove Foley in AM    Radioactive Seed Implant Home Care Instructions   Activity:    Rest for the remainder of the day.  Do not drive or operate equipment today.  You may resume normal  activities in a few days as instructed by your physician, without risk of harmful radiation exposure to those around you, provided you follow the time and distance precautions on the Radiation Oncology Instruction Sheet.   Meals: Drink plenty of lipuids and eat light foods, such as gelatin or soup this evening .  You may return to normal meal plan tomorrow.  Return To Work: You may return to work as instructed by Naval [employer].  Special Instruction:   If any seeds are found, use tweezers to pick up seeds and place in a glass container of any kind and bring to your physician's office.  Call your physician if any of these symptoms occur:   Persistent or heavy bleeding  Urine stream diminishes or stops completely after catheter is removed  Fever equal to or greater than 101 degrees F  Cloudy urine with a strong foul odor  Severe pain  You may feel some burning pain and/or hesitancy when you urinate after the catheter is removed.  These symptoms may increase over the next few weeks, but should diminish within forur to six weeks.  Applying moist heat to the lower abdomen or a hot tub bath may help relieve the pain.  If the discomfort becomes severe, please call your physician for additional medications.  Follow-up (Date of Return Visit to Physician): ***  Patient:_______________________________   @DATE @  Nurse:________________________________ @DATE @    Foley Catheter Care A Foley catheter is a soft, flexible tube that is placed into the bladder to drain urine. A Foley catheter may be inserted if:  You leak urine or are not able to control when you urinate (urinary incontinence).  You are not able to urinate when you need to (urinary retention).  You had  prostate surgery or surgery on the genitals.  You have certain medical conditions, such as multiple sclerosis, dementia, or a spinal cord injury. If you are going home with a Foley catheter in place, follow the instructions below. TAKING CARE OF THE CATHETER 1. Wash your hands with soap and water. 2. Using mild soap and warm water on a clean washcloth:  Clean the area on your body closest to the catheter insertion site using a circular motion, moving away from the catheter. Never wipe toward the catheter because this could sweep bacteria up into the urethra and cause infection.  Remove all traces of soap. Pat the area dry with a clean towel. For males, reposition the foreskin. 3. Attach the catheter to your leg so there is no tension on the catheter. Use adhesive tape or a leg strap. If you are using adhesive tape, remove any sticky residue left behind by the previous tape you used. 4. Keep the drainage bag below the level of the bladder, but keep it off the floor. 5. Check throughout the day to be sure the catheter is working and urine is draining freely. Make sure the tubing does not become kinked. 6. Do not pull on the catheter or try to remove it. Pulling could damage internal tissues. TAKING CARE OF THE DRAINAGE BAGS You will be given two drainage bags to take home. One is a large overnight drainage bag, and the other is a smaller leg bag that fits underneath clothing. You  may wear the overnight bag at any time, but you should never wear the smaller leg bag at night. Follow the instructions below for how to empty, change, and clean your drainage bags. Emptying the Drainage Bag You must empty your drainage bag when it is  - full or at least 2-3 times a day. 1. Wash your hands with soap and water. 2. Keep the drainage bag below your hips, below the level of your bladder. This stops urine from going back into the tubing and into your bladder. 3. Hold the dirty bag over the toilet or a clean  container. 4. Open the pour spout at the bottom of the bag and empty the urine into the toilet or container. Do not let the pour spout touch the toilet, container, or any other surface. Doing so can place bacteria on the bag, which can cause an infection. 5. Clean the pour spout with a gauze pad or cotton ball that has rubbing alcohol on it. 6. Close the pour spout. 7. Attach the bag to your leg with adhesive tape or a leg strap. 8. Wash your hands well. Changing the Drainage Bag Change your drainage bag once a month or sooner if it starts to smell bad or look dirty. Below are steps to follow when changing the drainage bag. 1. Wash your hands with soap and water. 2. Pinch off the rubber catheter so that urine does not spill out. 3. Disconnect the catheter tube from the drainage tube at the connection valve. Do not let the tubes touch any surface. 4. Clean the end of the catheter tube with an alcohol wipe. Use a different alcohol wipe to clean the end of the drainage tube. 5. Connect the catheter tube to the drainage tube of the clean drainage bag. 6. Attach the new bag to the leg with adhesive tape or a leg strap. Avoid attaching the new bag too tightly. 7. Wash your hands well. Cleaning the Drainage Bag 1. Wash your hands with soap and water. 2. Wash the bag in warm, soapy water. 3. Rinse the bag thoroughly with warm water. 4. Fill the bag with a solution of white vinegar and water (1 cup vinegar to 1 qt warm water [.2 L vinegar to 1 L warm water]). Close the bag and soak it for 30 minutes in the solution. 5. Rinse the bag with warm water. 6. Hang the bag to dry with the pour spout open and hanging downward. 7. Store the clean bag (once it is dry) in a clean plastic bag. 8. Wash your hands well. PREVENTING INFECTION  Wash your hands before and after handling your catheter.  Take showers daily and wash the area where the catheter enters your body. Do not take baths. Replace wet leg straps  with dry ones, if this applies.  Do not use powders, sprays, or lotions on the genital area. Only use creams, lotions, or ointments as directed by your caregiver.  For females, wipe from front to back after each bowel movement.  Drink enough fluids to keep your urine clear or pale yellow unless you have a fluid restriction.  Do not let the drainage bag or tubing touch or lie on the floor.  Wear cotton underwear to absorb moisture and to keep your skin drier. SEEK MEDICAL CARE IF:   Your urine is cloudy or smells unusually bad.  Your catheter becomes clogged.  You are not draining urine into the bag or your bladder feels full.  Your catheter starts  to leak. SEEK IMMEDIATE MEDICAL CARE IF:   You have pain, swelling, redness, or pus where the catheter enters the body.  You have pain in the abdomen, legs, lower back, or bladder.  You have a fever.  You see blood fill the catheter, or your urine is pink or red.  You have nausea, vomiting, or chills.  Your catheter gets pulled out. MAKE SURE YOU:   Understand these instructions.  Will watch your condition.  Will get help right away if you are not doing well or get worse. Document Released: 05/17/2005 Document Revised: 10/01/2013 Document Reviewed: 05/08/2012 [facility] Patient Information 2015 [place], [place]. This information is not intended to replace advice given to you by your health care provider. Make sure you discuss any questions you have with your health care provider.     Post Anesthesia Home Care Instructions  Activity: Get plenty of rest for the remainder of the day. A responsible adult should stay with you for 24 hours following the procedure.  For the next 24 hours, DO NOT: -Drive a car -Paediatric nurse -Drink alcoholic beverages -Take any medication unless instructed by your physician -Make any legal decisions or sign important papers.  Meals: Start with liquid foods such as gelatin or soup. Progress  to regular foods as tolerated. Avoid greasy, spicy, heavy foods. If nausea and/or vomiting occur, drink only clear liquids until the nausea and/or vomiting subsides. Call your physician if vomiting continues.  Special Instructions/Symptoms: Your throat may feel dry or sore from the anesthesia or the breathing tube placed in your throat during surgery. If this causes discomfort, gargle with warm salt water. The discomfort should disappear within 24 hours.

## 2014-01-29 NOTE — Progress Notes (Signed)
[facility] Operative Procedure Note  Name: Bruce Duarte MRN: [id number]  Date:   01/29/2014           DOB: February 06, 1936  Status:outpatient    CC: Dr. Lowella Bandy, Dr. [name]  DIAGNOSIS: A 78 year old gentlemen with stage T1 C. adenocarcinoma of the prostate with a Gleason of 6 and a PSA of 3.91.  PROCEDURE: Insertion of radioactive I-125 seeds into the prostate gland.  RADIATION DOSE: 140 Gy, definitive therapy.  TECHNIQUE: [name] was brought to the operating room with Dr. Janice Norrie. He was placed in the dorsolithotomy position. He was catheterized and a rectal tube was inserted. The perineum was shaved, prepped and draped. The ultrasound probe was then introduced into the rectum to see the prostate gland.  TREATMENT DEVICE: A needle grid was attached to the ultrasound probe stand and anchor needles were placed.  COMPLEX ISODOSE CALCULATION: The prostate was imaged in 3D using a sagittal sweep of the prostate probe. These images were transferred to the planning computer. There, the prostate, urethra and rectum were defined on each axial reconstructed image. Then, the software created an optimized plan and a few seed positions were adjusted. Then the accepted plan was uploaded to the seed Selectron afterloading unit.  SPECIAL TREATMENT PROCEDURE/SUPERVISION AND HANDLING: The Nucletron FIRST system was used to place the needles under sagittal guidance. A total of 25 needles were used to deposit 72 seeds in the prostate gland. The individual seed activity was 0.48 mCi for a total implant activity of 34.2 mCi.  COMPLEX SIMULATION: At the end of the procedure, an anterior radiograph of the pelvis was obtained to document seed positioning and count. Cystoscopy was performed to check the urethra and bladder.  MICRODOSIMETRY: At the end of the procedure, the patient was emitting 0.08 mrem/hr at 1 meter. Accordingly, he was considered safe for  hospital discharge.  PLAN: The patient will return to the radiation oncology clinic for post implant CT dosimetry in three weeks.

## 2014-01-30 ENCOUNTER — Encounter (HOSPITAL_BASED_OUTPATIENT_CLINIC_OR_DEPARTMENT_OTHER): Payer: Self-pay | Admitting: Urology

## 2014-02-18 ENCOUNTER — Telehealth: Payer: Self-pay | Admitting: *Deleted

## 2014-02-18 NOTE — Telephone Encounter (Signed)
CALLED PATIENT TO REMIND OF APPTS. FOR 02-19-14, SPOKE WITH PATIENT'S WIFE AND SHE IS AWARE OF THESE APPTS.

## 2014-02-19 ENCOUNTER — Ambulatory Visit
Admission: RE | Admit: 2014-02-19 | Discharge: 2014-02-19 | Disposition: A | Discharge status: Home / Self Care | Payer: Medicare Other | Source: Ambulatory Visit | Attending: Radiation Oncology | Admitting: Radiation Oncology

## 2014-02-19 ENCOUNTER — Encounter: Payer: Self-pay | Admitting: Radiation Oncology

## 2014-02-19 ENCOUNTER — Ambulatory Visit
Admit: 2014-02-19 | Discharge: 2014-02-19 | Disposition: A | Discharge status: Home / Self Care | Payer: PRIVATE HEALTH INSURANCE | Attending: Radiation Oncology | Admitting: Radiation Oncology

## 2014-02-19 VITALS — BP 138/69 | HR 79 | Temp 98.3°F | Resp 20

## 2014-02-19 DIAGNOSIS — Z510 Encounter for antineoplastic radiation therapy: Secondary | ICD-10-CM | POA: Diagnosis not present

## 2014-02-19 DIAGNOSIS — I10 Essential (primary) hypertension: Secondary | ICD-10-CM | POA: Diagnosis not present

## 2014-02-19 DIAGNOSIS — E119 Type 2 diabetes mellitus without complications: Secondary | ICD-10-CM | POA: Diagnosis not present

## 2014-02-19 DIAGNOSIS — C61 Malignant neoplasm of prostate: Secondary | ICD-10-CM

## 2014-02-19 DIAGNOSIS — N529 Male erectile dysfunction, unspecified: Secondary | ICD-10-CM | POA: Diagnosis not present

## 2014-02-19 DIAGNOSIS — E7800 Pure hypercholesterolemia, unspecified: Secondary | ICD-10-CM | POA: Diagnosis not present

## 2014-02-19 DIAGNOSIS — N390 Urinary tract infection, site not specified: Secondary | ICD-10-CM | POA: Diagnosis not present

## 2014-02-19 NOTE — Progress Notes (Signed)
CC: Dr. Lowella Bandy, Dr. [name]  Follow up note:  Bruce Duarte visits today approximately 3 weeks following his prostate seed implant with Bruce Duarte in the management of his stage TI C. favorable risk adenocarcinoma prostate. He is doing well from a GU and GI standpoint. He states that he does have a slow urinary stream in the morning but improves today. He denies dysuria. He is to see Bruce Duarte later today.   His CT scan today for his post implant dosimetry shows an excellent seed distribution.  Physical examination: Alert and oriented. Filed Vitals:   02/19/14 1051  BP: 138/69  Pulse: 79  Temp: 98.3 F (36.8 C)  Resp: 20   Rectal examination not performed today.  Impression: Satisfactory progress with minimal GU symptomatology.  Plan: He'll see Bruce Duarte today and then return to see Bruce Duarte in 2- 3 months for a followup visit and PSA determination. We'll move ahead with his post implant dosimetry and for the results of Bruce Duarte. I've not scheduled the patient for a formal followup visit and I ask that Bruce Duarte keep me posted on his progress

## 2014-02-19 NOTE — Progress Notes (Signed)
Patient denies pain, bowel issues, fatigue, loss of appetite. He states when he wakes in the morning his urinary stream is slow, but as the day goes on it typically returns to normal.

## 2014-02-19 NOTE — Progress Notes (Signed)
Complex simulation note: The patient was taken to the CT simulator. His pelvis was scanned. The CT data set was sent to the planning system for contouring of his prostate and rectum in preparation of 3-D conformal planning to assess the quality of his implant.

## 2014-03-25 ENCOUNTER — Encounter: Payer: Self-pay | Admitting: Radiation Oncology

## 2014-03-25 ENCOUNTER — Ambulatory Visit
Admission: RE | Admit: 2014-03-25 | Discharge: 2014-03-25 | Disposition: A | Discharge status: Home / Self Care | Payer: Medicare Other | Source: Ambulatory Visit | Attending: Radiation Oncology | Admitting: Radiation Oncology

## 2014-03-25 DIAGNOSIS — C61 Malignant neoplasm of prostate: Secondary | ICD-10-CM | POA: Diagnosis not present

## 2014-03-25 NOTE — Progress Notes (Signed)
CC: Dr. Beulah Gandy  Post-implant CT dosimetry note:  The patient completed post-implant CT dosimetry on 03/25/2014. His intraoperative prostate volume by ultrasound was 41.1 mL and his postoperative prostate volume by CT was 46.5 mL, a good correlation. Dose volume histograms were obtained for the rectum and prostate. His prostate D 90 is 111.6% and his V100 is 96.1%, both excellent. Only 0.03 mL of rectum receives the prescribed dose of 14,500 cGy. In summary, he has excellent post-implant dosimetry with a low risk for late rectal toxicity. His prognosis is excellent.

## 2014-04-02 DIAGNOSIS — E119 Type 2 diabetes mellitus without complications: Secondary | ICD-10-CM | POA: Diagnosis not present

## 2014-04-02 DIAGNOSIS — I10 Essential (primary) hypertension: Secondary | ICD-10-CM | POA: Diagnosis not present

## 2014-04-02 DIAGNOSIS — E785 Hyperlipidemia, unspecified: Secondary | ICD-10-CM | POA: Diagnosis not present

## 2014-05-15 DIAGNOSIS — N390 Urinary tract infection, site not specified: Secondary | ICD-10-CM | POA: Diagnosis not present

## 2014-05-15 DIAGNOSIS — C61 Malignant neoplasm of prostate: Secondary | ICD-10-CM | POA: Diagnosis not present

## 2014-05-22 DIAGNOSIS — C61 Malignant neoplasm of prostate: Secondary | ICD-10-CM | POA: Diagnosis not present

## 2014-05-22 DIAGNOSIS — N529 Male erectile dysfunction, unspecified: Secondary | ICD-10-CM | POA: Diagnosis not present

## 2014-05-22 DIAGNOSIS — N139 Obstructive and reflux uropathy, unspecified: Secondary | ICD-10-CM | POA: Diagnosis not present

## 2014-08-06 ENCOUNTER — Ambulatory Visit (HOSPITAL_COMMUNITY)
Admission: RE | Admit: 2014-08-06 | Discharge: 2014-08-06 | Disposition: A | Discharge status: Home / Self Care | Payer: Medicare Other | Source: Ambulatory Visit | Attending: Family Medicine | Admitting: Family Medicine

## 2014-08-06 ENCOUNTER — Other Ambulatory Visit (HOSPITAL_COMMUNITY): Payer: Self-pay | Admitting: Family Medicine

## 2014-08-06 DIAGNOSIS — M1389 Other specified arthritis, multiple sites: Secondary | ICD-10-CM

## 2014-08-06 DIAGNOSIS — M25551 Pain in right hip: Secondary | ICD-10-CM | POA: Diagnosis not present

## 2014-08-06 DIAGNOSIS — E1169 Type 2 diabetes mellitus with other specified complication: Secondary | ICD-10-CM | POA: Diagnosis not present

## 2014-08-06 DIAGNOSIS — R972 Elevated prostate specific antigen [PSA]: Secondary | ICD-10-CM | POA: Diagnosis not present

## 2014-08-06 DIAGNOSIS — I10 Essential (primary) hypertension: Secondary | ICD-10-CM | POA: Diagnosis not present

## 2014-08-06 DIAGNOSIS — E782 Mixed hyperlipidemia: Secondary | ICD-10-CM | POA: Diagnosis not present

## 2014-08-06 DIAGNOSIS — M1611 Unilateral primary osteoarthritis, right hip: Secondary | ICD-10-CM | POA: Diagnosis not present

## 2014-08-06 DIAGNOSIS — Z8546 Personal history of malignant neoplasm of prostate: Secondary | ICD-10-CM | POA: Diagnosis not present

## 2014-08-15 DIAGNOSIS — C61 Malignant neoplasm of prostate: Secondary | ICD-10-CM | POA: Diagnosis not present

## 2014-08-22 DIAGNOSIS — C61 Malignant neoplasm of prostate: Secondary | ICD-10-CM | POA: Diagnosis not present

## 2014-08-22 DIAGNOSIS — N529 Male erectile dysfunction, unspecified: Secondary | ICD-10-CM | POA: Diagnosis not present

## 2014-11-28 DIAGNOSIS — C61 Malignant neoplasm of prostate: Secondary | ICD-10-CM | POA: Diagnosis not present

## 2014-11-29 DIAGNOSIS — I10 Essential (primary) hypertension: Secondary | ICD-10-CM | POA: Diagnosis not present

## 2014-11-29 DIAGNOSIS — E782 Mixed hyperlipidemia: Secondary | ICD-10-CM | POA: Diagnosis not present

## 2014-12-04 ENCOUNTER — Other Ambulatory Visit: Payer: Self-pay | Admitting: Family Medicine

## 2014-12-04 ENCOUNTER — Ambulatory Visit
Admission: RE | Admit: 2014-12-04 | Discharge: 2014-12-04 | Disposition: A | Discharge status: Home / Self Care | Payer: Medicare Other | Source: Ambulatory Visit | Attending: Family Medicine | Admitting: Family Medicine

## 2014-12-04 DIAGNOSIS — R0989 Other specified symptoms and signs involving the circulatory and respiratory systems: Secondary | ICD-10-CM | POA: Diagnosis not present

## 2014-12-04 DIAGNOSIS — E1169 Type 2 diabetes mellitus with other specified complication: Secondary | ICD-10-CM | POA: Diagnosis not present

## 2014-12-04 DIAGNOSIS — I10 Essential (primary) hypertension: Secondary | ICD-10-CM | POA: Diagnosis not present

## 2014-12-05 DIAGNOSIS — N529 Male erectile dysfunction, unspecified: Secondary | ICD-10-CM | POA: Diagnosis not present

## 2014-12-05 DIAGNOSIS — C61 Malignant neoplasm of prostate: Secondary | ICD-10-CM | POA: Diagnosis not present

## 2014-12-11 DIAGNOSIS — R972 Elevated prostate specific antigen [PSA]: Secondary | ICD-10-CM | POA: Diagnosis not present

## 2014-12-11 DIAGNOSIS — E782 Mixed hyperlipidemia: Secondary | ICD-10-CM | POA: Diagnosis not present

## 2014-12-11 DIAGNOSIS — I10 Essential (primary) hypertension: Secondary | ICD-10-CM | POA: Diagnosis not present

## 2014-12-11 DIAGNOSIS — E1169 Type 2 diabetes mellitus with other specified complication: Secondary | ICD-10-CM | POA: Diagnosis not present

## 2015-01-20 DIAGNOSIS — E119 Type 2 diabetes mellitus without complications: Secondary | ICD-10-CM | POA: Diagnosis not present

## 2015-01-20 DIAGNOSIS — I10 Essential (primary) hypertension: Secondary | ICD-10-CM | POA: Diagnosis not present

## 2015-01-20 DIAGNOSIS — Z86010 Personal history of colonic polyps: Secondary | ICD-10-CM | POA: Diagnosis not present

## 2015-01-20 DIAGNOSIS — C61 Malignant neoplasm of prostate: Secondary | ICD-10-CM | POA: Diagnosis not present

## 2015-01-30 HISTORY — PX: COLONOSCOPY: SHX174

## 2015-02-06 DIAGNOSIS — Z0000 Encounter for general adult medical examination without abnormal findings: Secondary | ICD-10-CM | POA: Diagnosis not present

## 2015-02-18 DIAGNOSIS — Z1211 Encounter for screening for malignant neoplasm of colon: Secondary | ICD-10-CM | POA: Diagnosis not present

## 2015-02-18 DIAGNOSIS — Z86010 Personal history of colonic polyps: Secondary | ICD-10-CM | POA: Diagnosis not present

## 2015-02-18 DIAGNOSIS — K5730 Diverticulosis of large intestine without perforation or abscess without bleeding: Secondary | ICD-10-CM | POA: Diagnosis not present

## 2015-02-18 DIAGNOSIS — D128 Benign neoplasm of rectum: Secondary | ICD-10-CM | POA: Diagnosis not present

## 2015-02-18 DIAGNOSIS — K621 Rectal polyp: Secondary | ICD-10-CM | POA: Diagnosis not present

## 2015-03-04 DIAGNOSIS — C61 Malignant neoplasm of prostate: Secondary | ICD-10-CM | POA: Diagnosis not present

## 2015-03-12 ENCOUNTER — Ambulatory Visit (INDEPENDENT_AMBULATORY_CARE_PROVIDER_SITE_OTHER): Payer: Medicare Other | Admitting: Urology

## 2015-03-12 DIAGNOSIS — N139 Obstructive and reflux uropathy, unspecified: Secondary | ICD-10-CM

## 2015-03-12 DIAGNOSIS — R972 Elevated prostate specific antigen [PSA]: Secondary | ICD-10-CM

## 2015-03-12 DIAGNOSIS — N529 Male erectile dysfunction, unspecified: Secondary | ICD-10-CM

## 2015-03-12 DIAGNOSIS — C61 Malignant neoplasm of prostate: Secondary | ICD-10-CM | POA: Diagnosis not present

## 2015-04-16 DIAGNOSIS — I10 Essential (primary) hypertension: Secondary | ICD-10-CM | POA: Diagnosis not present

## 2015-04-16 DIAGNOSIS — E1169 Type 2 diabetes mellitus with other specified complication: Secondary | ICD-10-CM | POA: Diagnosis not present

## 2015-04-16 DIAGNOSIS — Z23 Encounter for immunization: Secondary | ICD-10-CM | POA: Diagnosis not present

## 2015-04-16 DIAGNOSIS — E782 Mixed hyperlipidemia: Secondary | ICD-10-CM | POA: Diagnosis not present

## 2015-06-09 DIAGNOSIS — C61 Malignant neoplasm of prostate: Secondary | ICD-10-CM | POA: Diagnosis not present

## 2015-06-18 ENCOUNTER — Ambulatory Visit (INDEPENDENT_AMBULATORY_CARE_PROVIDER_SITE_OTHER): Payer: Medicare Other | Admitting: Urology

## 2015-06-18 DIAGNOSIS — N390 Urinary tract infection, site not specified: Secondary | ICD-10-CM | POA: Diagnosis not present

## 2015-06-18 DIAGNOSIS — N5201 Erectile dysfunction due to arterial insufficiency: Secondary | ICD-10-CM | POA: Diagnosis not present

## 2015-06-18 DIAGNOSIS — C61 Malignant neoplasm of prostate: Secondary | ICD-10-CM

## 2015-06-18 DIAGNOSIS — N139 Obstructive and reflux uropathy, unspecified: Secondary | ICD-10-CM

## 2015-08-15 DIAGNOSIS — I10 Essential (primary) hypertension: Secondary | ICD-10-CM | POA: Diagnosis not present

## 2015-08-15 DIAGNOSIS — E782 Mixed hyperlipidemia: Secondary | ICD-10-CM | POA: Diagnosis not present

## 2015-08-15 DIAGNOSIS — E1169 Type 2 diabetes mellitus with other specified complication: Secondary | ICD-10-CM | POA: Diagnosis not present

## 2015-08-15 DIAGNOSIS — R0989 Other specified symptoms and signs involving the circulatory and respiratory systems: Secondary | ICD-10-CM | POA: Diagnosis not present

## 2015-08-15 DIAGNOSIS — Z6833 Body mass index (BMI) 33.0-33.9, adult: Secondary | ICD-10-CM | POA: Diagnosis not present

## 2015-09-08 DIAGNOSIS — C61 Malignant neoplasm of prostate: Secondary | ICD-10-CM | POA: Diagnosis not present

## 2015-09-17 ENCOUNTER — Ambulatory Visit: Payer: Medicare Other | Admitting: Urology

## 2015-09-29 DIAGNOSIS — E119 Type 2 diabetes mellitus without complications: Secondary | ICD-10-CM | POA: Diagnosis not present

## 2015-09-29 DIAGNOSIS — H5203 Hypermetropia, bilateral: Secondary | ICD-10-CM | POA: Diagnosis not present

## 2015-09-29 DIAGNOSIS — H52223 Regular astigmatism, bilateral: Secondary | ICD-10-CM | POA: Diagnosis not present

## 2015-09-29 DIAGNOSIS — Z7984 Long term (current) use of oral hypoglycemic drugs: Secondary | ICD-10-CM | POA: Diagnosis not present

## 2015-10-08 ENCOUNTER — Ambulatory Visit (INDEPENDENT_AMBULATORY_CARE_PROVIDER_SITE_OTHER): Payer: Medicare Other | Admitting: Urology

## 2015-10-08 DIAGNOSIS — N5201 Erectile dysfunction due to arterial insufficiency: Secondary | ICD-10-CM | POA: Diagnosis not present

## 2015-10-08 DIAGNOSIS — C61 Malignant neoplasm of prostate: Secondary | ICD-10-CM

## 2015-11-14 DIAGNOSIS — I10 Essential (primary) hypertension: Secondary | ICD-10-CM | POA: Diagnosis not present

## 2015-11-14 DIAGNOSIS — E1169 Type 2 diabetes mellitus with other specified complication: Secondary | ICD-10-CM | POA: Diagnosis not present

## 2015-12-25 IMAGING — CR DG CHEST 2V
2 series · 2 of 2 positions shown · non-contrast
Comparison: None.

CLINICAL DATA: Preoperative respect for exam for prostate surgery.
Hypertension and diabetes.

EXAM:
CHEST  2 VIEW

[w chest pa]
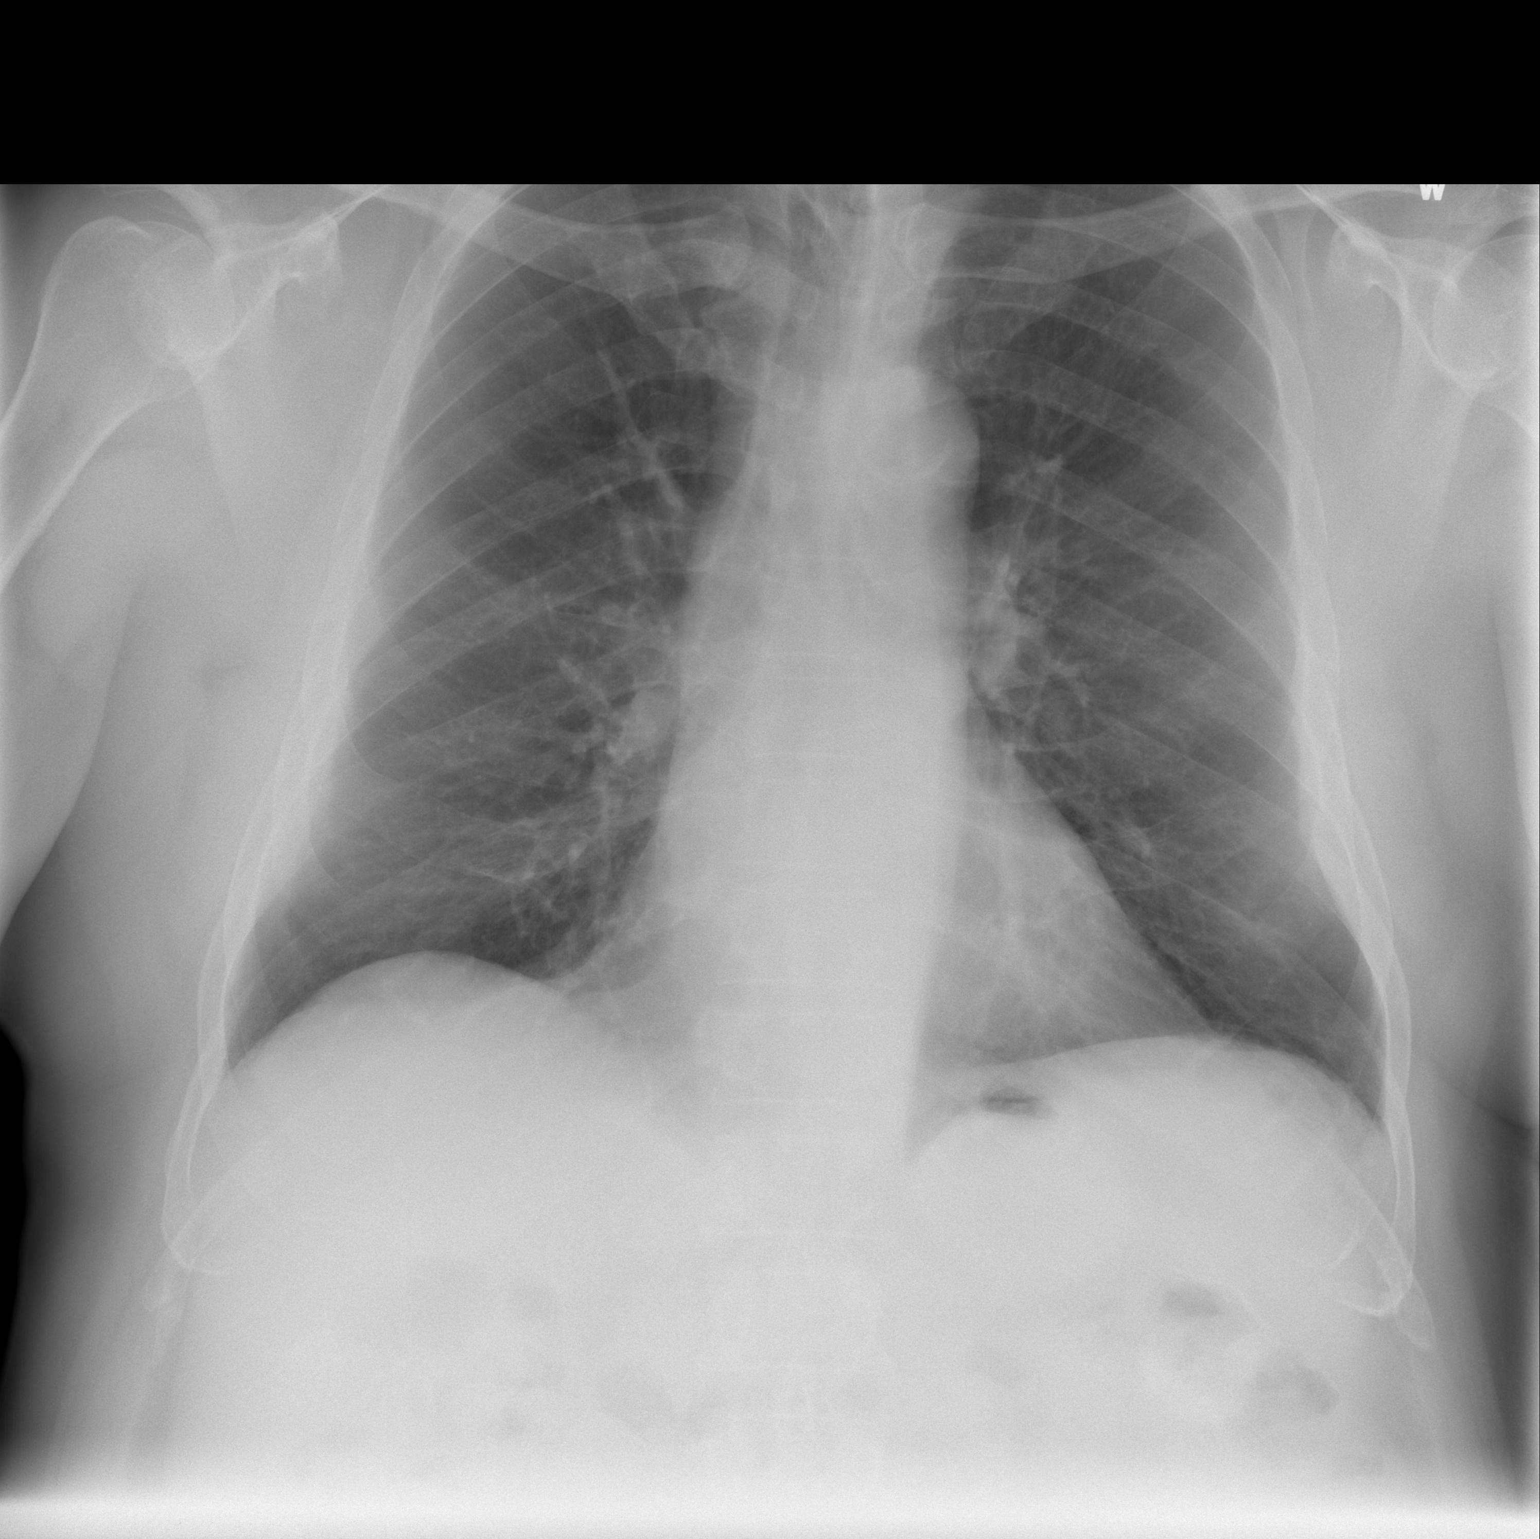

[w chest lat]
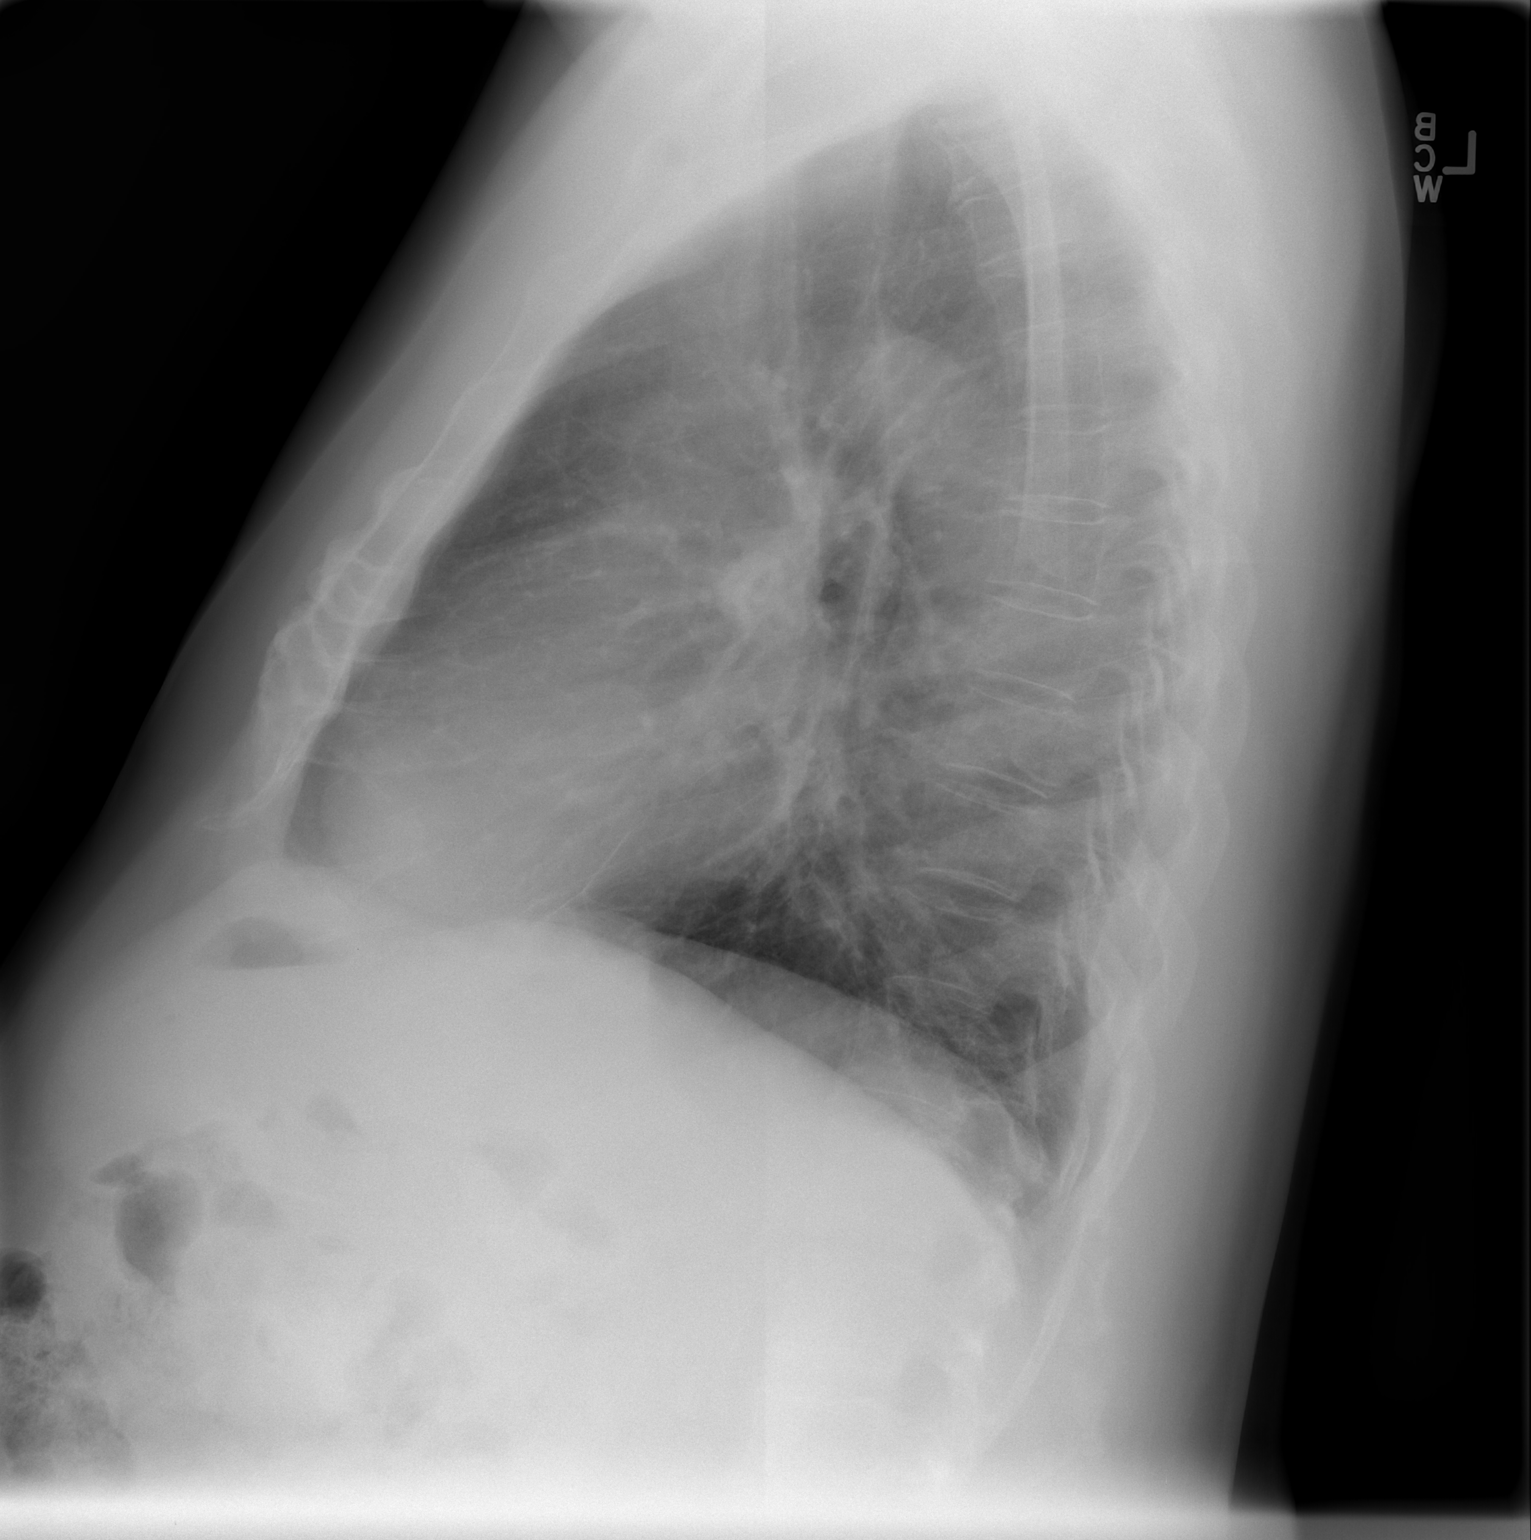

[2 of 2 positions shown; findings below may reference images not displayed]

FINDINGS: Heart size is normal. Mediastinal shadows are normal except for some
calcification of the thoracic aorta. Lungs are clear. The
vascularity is normal. No effusions. No bony abnormalities.
IMPRESSION: No active cardiopulmonary disease.

## 2016-01-23 ENCOUNTER — Other Ambulatory Visit: Payer: Self-pay

## 2016-03-16 DIAGNOSIS — Z6833 Body mass index (BMI) 33.0-33.9, adult: Secondary | ICD-10-CM | POA: Diagnosis not present

## 2016-03-16 DIAGNOSIS — E1169 Type 2 diabetes mellitus with other specified complication: Secondary | ICD-10-CM | POA: Diagnosis not present

## 2016-03-16 DIAGNOSIS — I10 Essential (primary) hypertension: Secondary | ICD-10-CM | POA: Diagnosis not present

## 2016-03-16 DIAGNOSIS — Z23 Encounter for immunization: Secondary | ICD-10-CM | POA: Diagnosis not present

## 2016-03-16 DIAGNOSIS — Z79899 Other long term (current) drug therapy: Secondary | ICD-10-CM | POA: Diagnosis not present

## 2016-03-19 ENCOUNTER — Encounter: Payer: Self-pay | Admitting: Internal Medicine

## 2016-04-05 DIAGNOSIS — N5201 Erectile dysfunction due to arterial insufficiency: Secondary | ICD-10-CM | POA: Diagnosis not present

## 2016-04-07 ENCOUNTER — Other Ambulatory Visit: Payer: Self-pay

## 2016-04-07 ENCOUNTER — Encounter: Payer: Self-pay | Admitting: Gastroenterology

## 2016-04-07 ENCOUNTER — Ambulatory Visit (INDEPENDENT_AMBULATORY_CARE_PROVIDER_SITE_OTHER): Payer: Medicare Other | Admitting: Gastroenterology

## 2016-04-07 DIAGNOSIS — D509 Iron deficiency anemia, unspecified: Secondary | ICD-10-CM

## 2016-04-07 DIAGNOSIS — Z86010 Personal history of colonic polyps: Secondary | ICD-10-CM

## 2016-04-07 MED ORDER — PEG-KCL-NACL-NASULF-NA ASC-C 100 G PO SOLR: 1.0000 | ORAL | 0 refills | Status: DC

## 2016-04-07 NOTE — Assessment & Plan Note (Signed)
80 year old gentleman with history of new onset microcytic anemia, previous history of prostate cancer 2 years ago, remote colonoscopy with history of colon polyps. Mild GERD intermittently, does not require daily medication. We have requested most recent labs. At this time we'll plan on colonoscopy plus or minus upper endoscopy for microcytic anemia which is likely due to IDA.  I have discussed the risks, alternatives, benefits with regards to but not limited to the risk of reaction to medication, bleeding, infection, perforation and the patient is agreeable to proceed. Written consent to be obtained.

## 2016-04-07 NOTE — Progress Notes (Signed)
Primary Care Physician:  Elyn Peers, MD  Primary Gastroenterologist:  Garfield Cornea, MD   Chief Complaint  Patient presents with  . Colonoscopy    last one 5 yrs ago, hx prostate cancer 2 yrs ago  . Anemia    HPI:  Bruce Duarte is a 80 y.o. male here For further evaluation of microcytic anemia and request for colonoscopy. Patient was noted to have new microcytic anemia back in June as outlined below. At this time I do not have his most recent labs but they have been requested. Patient reports his last colonoscopy was about 5 years ago at [facility]. He says he has a history of polyps. He does say he had EGD too for GERD.  Overall feels well. No bowel issues. Denies blood in the stool or melena. Manages his reflux with dietary measures. Does not require daily medication. Denies frequent NSAID use. Takes aspirin 81 mg daily. Denies vomiting, dysphagia, abdominal pain. No weight loss. History of prostate cancer 2 years ago.   Current Outpatient Prescriptions  Medication Sig Dispense Refill  . aspirin EC 81 MG tablet Take 81 mg by mouth daily.    . Cholecalciferol (VITAMIN D3) 1000 UNITS CAPS Take by mouth daily.     Marland Kitchen docusate sodium (COLACE) 100 MG capsule Take 200 mg by mouth daily.    Marland Kitchen losartan-hydrochlorothiazide (HYZAAR) 100-25 MG per tablet Take 1 tablet by mouth every morning.     . metFORMIN (GLUMETZA) 500 MG (MOD) 24 hr tablet Take 500 mg by mouth at bedtime.     . Misc Natural Products (GINSENG COMPLEX PO) Take 1 capsule by mouth daily.     . Multiple Vitamins-Minerals (MULTIVITAMIN GUMMIES MENS PO) Take 1 tablet by mouth daily.    . tamsulosin (FLOMAX) 0.4 MG CAPS capsule Take 0.4 mg by mouth daily.    . vitamin E 400 UNIT capsule Take 400 Units by mouth daily.     No current facility-administered medications for this visit.     Allergies as of 04/07/2016  . (No Known Allergies)    Past Medical History:  Diagnosis Date  . Arthritis   . At risk for  sleep apnea    STOP-BANG= 5   SENT TO PCP 01-22-2014  . Borderline type 2 diabetes mellitus   . Hyperlipidemia   . Hypertension   . Prostate cancer ([place]) DX  2013   Gleason 3+3=6, vol 42.34 cc  . Wears glasses     Past Surgical History:  Procedure Laterality Date  . APPENDECTOMY  age 12  . COLONOSCOPY W/ BIOPSIES  2009   non-cancerous  . PROSTATE BIOPSY  11/05/13   adenocarcinoma, gleason 6  . RADIOACTIVE SEED IMPLANT N/A 01/29/2014   Procedure: RADIOACTIVE SEED IMPLANT;  Surgeon: Arvil Persons, MD;  Location: [facility];  Service: Urology;  Laterality: N/A;     seeds implanted no seeds found in bladder    Family History  Problem Relation Age of Onset  . Diabetes Mother   . Stroke Father   . Cancer Cousin     prostate  . Colon cancer Neg Hx     Social History   Social History  . Marital status: Married    Spouse name: N/A  . Number of children: N/A  . Years of education: N/A   Occupational History  . Not on file.   Social History Main Topics  . Smoking status: Former Smoker    Packs/day: 1.00  Years: 25.00    Types: Cigarettes, Pipe, Cigars    Quit date: 12/04/1973  . Smokeless tobacco: Never Used  . Alcohol use Yes     Comment: occasionally brandy  . Drug use: No  . Sexual activity: Not on file   Other Topics Concern  . Not on file   Social History Narrative  . No narrative on file      ROS:  General: Negative for anorexia, weight loss, fever, chills, fatigue, weakness. Eyes: Negative for vision changes.  ENT: Negative for hoarseness, difficulty swallowing , nasal congestion. CV: Negative for chest pain, angina, palpitations, dyspnea on exertion, peripheral edema.  Respiratory: Negative for dyspnea at rest, dyspnea on exertion, cough, sputum, wheezing.  GI: See history of present illness. GU:  Negative for dysuria, hematuria, urinary incontinence, urinary frequency, nocturnal urination.  MS: Negative for joint pain, low back pain.   Derm: Negative for rash or itching.  Neuro: Negative for weakness, abnormal sensation, seizure, frequent headaches, memory loss, confusion.  Psych: Negative for anxiety, depression, suicidal ideation, hallucinations.  Endo: Negative for unusual weight change.  Heme: Negative for bruising or bleeding. Allergy: Negative for rash or hives.    Physical Examination:  BP 130/74   Pulse 90   Temp 97.8 F (36.6 C) (Oral)   Ht 5\' 9"  (1.753 m)   Wt 220 lb 9.6 oz (100.1 kg)   BMI 32.58 kg/m    General: Well-nourished, well-developed in no acute distress.  Head: Normocephalic, atraumatic.   Eyes: Conjunctiva pink, no icterus. Mouth: Oropharyngeal mucosa moist and pink , no lesions erythema or exudate. Neck: Supple without thyromegaly, masses, or lymphadenopathy.  Lungs: Clear to auscultation bilaterally.  Heart: Regular rate and rhythm, no murmurs rubs or gallops.  Abdomen: Bowel sounds are normal, nontender, nondistended, no hepatosplenomegaly or masses, no abdominal bruits or    hernia , no rebound or guarding.   Rectal: Deferred Extremities: No lower extremity edema. No clubbing or deformities.  Neuro: Alert and oriented x 4 , grossly normal neurologically.  Skin: Warm and dry, no rash or jaundice.   Psych: Alert and cooperative, normal mood and affect.  Labs: Labs from June 2017 Sodium 137, potassium 3.7, BUN 14, creatinine 1.02, total bilirubin 0.3, alkaline phosphatase 43, AST 28, ALT 22, albumin 4.2, white blood cell count 3500, hemoglobin 11.1, hematocrit 35.8, MCV 75.7, platelets 311,000. Hemoglobin A1c 6.3.  Imaging Studies: No results found.

## 2016-04-07 NOTE — Progress Notes (Signed)
cc'ed to pcp °

## 2016-04-07 NOTE — Patient Instructions (Signed)
1. Colonoscopy and possible upper endoscopy as scheduled. See separate instructions. 2. I have requested records from Dr. Criss Rosales regarding latest labs. 3. We have also requested records of your prior colonoscopy and upper endoscopy results to have on record.

## 2016-04-08 NOTE — Progress Notes (Signed)
Received labs from PCP dated 03/16/2016 Sodium 136, potassium 3.6, BUN 14, creatinine 1.03, total bilirubin 0.3, alkaline phosphatase 48, AST 29, ALT 22, albumin 4, white blood cell count 3300, hemoglobin 11, hematocrit 35, MCV 73.2, platelets 317,000, white blood cell count 3300 low, hemoglobin A1c 6.1, vitamin B12 430, folate 19.2, ferritin 8 low, total iron 24 low, TIBC 398, iron saturation 6% low.   Labs consistent with iron deficiency anemia. Proceed with colonoscopy with possible EGD.

## 2016-04-14 ENCOUNTER — Ambulatory Visit (INDEPENDENT_AMBULATORY_CARE_PROVIDER_SITE_OTHER): Payer: Medicare Other | Admitting: Urology

## 2016-04-14 DIAGNOSIS — C61 Malignant neoplasm of prostate: Secondary | ICD-10-CM | POA: Diagnosis not present

## 2016-04-14 DIAGNOSIS — N401 Enlarged prostate with lower urinary tract symptoms: Secondary | ICD-10-CM

## 2016-04-14 DIAGNOSIS — N5201 Erectile dysfunction due to arterial insufficiency: Secondary | ICD-10-CM | POA: Diagnosis not present

## 2016-04-28 DIAGNOSIS — D508 Other iron deficiency anemias: Secondary | ICD-10-CM | POA: Diagnosis not present

## 2016-04-28 DIAGNOSIS — E1169 Type 2 diabetes mellitus with other specified complication: Secondary | ICD-10-CM | POA: Diagnosis not present

## 2016-04-28 DIAGNOSIS — E782 Mixed hyperlipidemia: Secondary | ICD-10-CM | POA: Diagnosis not present

## 2016-05-05 ENCOUNTER — Encounter (HOSPITAL_COMMUNITY): Payer: Self-pay | Admitting: *Deleted

## 2016-05-05 ENCOUNTER — Encounter (HOSPITAL_COMMUNITY)
Admission: RE | Disposition: A | Discharge status: Home / Self Care | Payer: Self-pay | Source: Ambulatory Visit | Attending: Internal Medicine

## 2016-05-05 ENCOUNTER — Ambulatory Visit (HOSPITAL_COMMUNITY)
Admission: RE | Admit: 2016-05-05 | Discharge: 2016-05-05 | Disposition: A | Discharge status: Home / Self Care | Payer: Medicare Other | Source: Ambulatory Visit | Attending: Internal Medicine | Admitting: Internal Medicine

## 2016-05-05 DIAGNOSIS — K219 Gastro-esophageal reflux disease without esophagitis: Secondary | ICD-10-CM | POA: Diagnosis not present

## 2016-05-05 DIAGNOSIS — E785 Hyperlipidemia, unspecified: Secondary | ICD-10-CM | POA: Diagnosis not present

## 2016-05-05 DIAGNOSIS — Z86010 Personal history of colonic polyps: Secondary | ICD-10-CM | POA: Diagnosis not present

## 2016-05-05 DIAGNOSIS — K3189 Other diseases of stomach and duodenum: Secondary | ICD-10-CM

## 2016-05-05 DIAGNOSIS — K2950 Unspecified chronic gastritis without bleeding: Secondary | ICD-10-CM | POA: Insufficient documentation

## 2016-05-05 DIAGNOSIS — K259 Gastric ulcer, unspecified as acute or chronic, without hemorrhage or perforation: Secondary | ICD-10-CM | POA: Diagnosis not present

## 2016-05-05 DIAGNOSIS — D123 Benign neoplasm of transverse colon: Secondary | ICD-10-CM | POA: Insufficient documentation

## 2016-05-05 DIAGNOSIS — K5730 Diverticulosis of large intestine without perforation or abscess without bleeding: Secondary | ICD-10-CM | POA: Insufficient documentation

## 2016-05-05 DIAGNOSIS — Z87891 Personal history of nicotine dependence: Secondary | ICD-10-CM | POA: Diagnosis not present

## 2016-05-05 DIAGNOSIS — M1990 Unspecified osteoarthritis, unspecified site: Secondary | ICD-10-CM | POA: Insufficient documentation

## 2016-05-05 DIAGNOSIS — Z7982 Long term (current) use of aspirin: Secondary | ICD-10-CM | POA: Insufficient documentation

## 2016-05-05 DIAGNOSIS — D509 Iron deficiency anemia, unspecified: Secondary | ICD-10-CM | POA: Diagnosis not present

## 2016-05-05 DIAGNOSIS — I10 Essential (primary) hypertension: Secondary | ICD-10-CM | POA: Insufficient documentation

## 2016-05-05 DIAGNOSIS — Z8546 Personal history of malignant neoplasm of prostate: Secondary | ICD-10-CM | POA: Insufficient documentation

## 2016-05-05 HISTORY — PX: ESOPHAGOGASTRODUODENOSCOPY: SHX5428

## 2016-05-05 HISTORY — PX: COLONOSCOPY: SHX5424

## 2016-05-05 LAB — GLUCOSE, CAPILLARY: Glucose-Capillary: 98 mg/dL (ref 65–99)

## 2016-05-05 SURGERY — COLONOSCOPY
Anesthesia: Moderate Sedation

## 2016-05-05 MED ORDER — LIDOCAINE VISCOUS 2 % MT SOLN
OROMUCOSAL | Status: AC
  Filled 2016-05-05: qty 15

## 2016-05-05 MED ORDER — MEPERIDINE HCL 100 MG/ML IJ SOLN
INTRAMUSCULAR | Status: DC | PRN
  Administered 2016-05-05 (×3): 25 mg via INTRAVENOUS

## 2016-05-05 MED ORDER — MEPERIDINE HCL 100 MG/ML IJ SOLN
INTRAMUSCULAR | Status: AC
  Filled 2016-05-05: qty 2

## 2016-05-05 MED ORDER — MIDAZOLAM HCL 5 MG/5ML IJ SOLN
INTRAMUSCULAR | Status: DC | PRN
  Administered 2016-05-05: 1 mg via INTRAVENOUS
  Administered 2016-05-05: 2 mg via INTRAVENOUS
  Administered 2016-05-05 (×2): 1 mg via INTRAVENOUS

## 2016-05-05 MED ORDER — ONDANSETRON HCL 4 MG/2ML IJ SOLN
INTRAMUSCULAR | Status: AC
  Filled 2016-05-05: qty 2

## 2016-05-05 MED ORDER — SODIUM CHLORIDE 0.9 % IV SOLN
INTRAVENOUS | Status: DC
  Administered 2016-05-05: 1000 mL via INTRAVENOUS

## 2016-05-05 MED ORDER — MIDAZOLAM HCL 5 MG/5ML IJ SOLN
INTRAMUSCULAR | Status: AC
  Filled 2016-05-05: qty 10

## 2016-05-05 MED ORDER — ONDANSETRON HCL 4 MG/2ML IJ SOLN
INTRAMUSCULAR | Status: DC | PRN
Start: 2016-05-05 — End: 2016-05-05
  Administered 2016-05-05: 4 mg via INTRAVENOUS

## 2016-05-05 MED ORDER — LIDOCAINE VISCOUS 2 % MT SOLN
OROMUCOSAL | Status: DC | PRN
  Administered 2016-05-05: 1 via OROMUCOSAL

## 2016-05-05 NOTE — Interval H&P Note (Signed)
History and Physical Interval Note:  05/05/2016 8:19 AM  Bruce Duarte  has presented today for surgery, with the diagnosis of history of polpys  The various methods of treatment have been discussed with the patient and family. After consideration of risks, benefits and other options for treatment, the patient has consented to  Procedure(s) with comments: COLONOSCOPY (N/A) - 815 ESOPHAGOGASTRODUODENOSCOPY (EGD) (N/A) as a surgical intervention .  The patient's history has been reviewed, patient examined, no change in status, stable for surgery.  I have reviewed the patient's chart and labs.  Questions were answered to the patient's satisfaction.    No change. Patient denies dysphagia. Colonoscopy with possible EGD to follow per plan.  The risks, benefits, limitations, imponderables and alternatives regarding both EGD and colonoscopy have been reviewed with the patient. Questions have been answered. All parties agreeable.    Manus Rudd

## 2016-05-05 NOTE — Op Note (Signed)
[facility] Patient Name: Bruce Duarte Procedure Date: 05/05/2016 8:24 AM MRN: [id number] Date of Birth: 25-Apr-1936 Attending MD: Norvel Richards , MD CSN: [id number] Age: 80 Admit Type: Outpatient Procedure:                Colonoscopy with snare polypectomy Indications:              Unexplained iron deficiency anemia Providers:                Norvel Richards, MD, Lurline Del, RN, [employer].                            Catharine, [employer] Referring MD:              Medicines:                Midazolam 4 mg IV, Meperidine 50 mg IV, Ondansetron                            4 mg IV Complications:            No immediate complications. Estimated Blood Loss:     Estimated blood loss was minimal. Procedure:                Pre-Anesthesia Assessment:                           - Prior to the procedure, a History and Physical                            was performed, and patient medications and                            allergies were reviewed. The patient's tolerance of                            previous anesthesia was also reviewed. The risks                            and benefits of the procedure and the sedation                            options and risks were discussed with the patient.                            All questions were answered, and informed consent                            was obtained. Prior Anticoagulants: The patient has                            taken no previous anticoagulant or antiplatelet                            agents. ASA Grade Assessment: III - A patient with  severe systemic disease. After reviewing the risks                            and benefits, the patient was deemed in                            satisfactory condition to undergo the procedure.                           After obtaining informed consent, the colonoscope                            was passed under direct vision. Throughout the       procedure, the patient's blood pressure, pulse, and                            oxygen saturations were monitored continuously. The                            Colonoscope was introduced through the anus and                            advanced to the the cecum, identified by                            appendiceal orifice and ileocecal valve. The                            colonoscopy was performed without difficulty. The                            patient tolerated the procedure well. The quality                            of the bowel preparation was adequate. The                            ileocecal valve, appendiceal orifice, and rectum                            were photographed. The quality of the bowel                            preparation was adequate. Scope In: 8:31:40 AM Scope Out: 8:51:38 AM Scope Withdrawal Time: 0 hours 13 minutes 17 seconds  Total Procedure Duration: 0 hours 19 minutes 58 seconds  Findings:      The perianal and digital rectal examinations were normal.      A 5 mm polyp was found in the splenic flexure. The polyp was       semi-pedunculated. The polyp was removed with a cold snare. Resection       and retrieval were complete. Estimated blood loss was minimal.      Multiple small and large-mouthed diverticula were found in the sigmoid       colon and descending  colon.      The exam was otherwise without abnormality on direct and retroflexion       views. Impression:               - One 5 mm polyp at the splenic flexure, removed                            with a cold snare. Resected and retrieved.                           - Diverticulosis in the sigmoid colon and in the                            descending colon.                           - The examination was otherwise normal on direct                            and retroflexion views. Moderate Sedation:      Moderate (conscious) sedation was administered by the endoscopy nurse       and supervised  by the endoscopist. The following parameters were       monitored: oxygen saturation, heart rate, blood pressure, respiratory       rate, EKG, adequacy of pulmonary ventilation, and response to care.       Total physician intraservice time was 29 minutes. Recommendation:           - Patient has a contact number available for                            emergencies. The signs and symptoms of potential                            delayed complications were discussed with the                            patient. Return to normal activities tomorrow.                            Written discharge instructions were provided to the                            patient.                           - Resume previous diet.                           - Continue present medications.                           - Repeat colonoscopy date to be determined after                            pending pathology results are reviewed for  surveillance based on pathology results.                           - Return to GI office after studies are complete. Procedure Code(s):        --- Professional ---                           [phone], Colonoscopy, flexible; with removal of                            tumor(s), polyp(s), or other lesion(s) by snare                            technique                           99152, Moderate sedation services provided by the                            same physician or other qualified health care                            professional performing the diagnostic or                            therapeutic service that the sedation supports,                            requiring the presence of an independent trained                            observer to assist in the monitoring of the                            patient's level of consciousness and physiological                            status; initial 15 minutes of intraservice time,                            patient  age 55 years or older                           [phone], Moderate sedation services; each additional                            15 minutes intraservice time Diagnosis Code(s):        --- Professional ---                           D12.3, Benign neoplasm of transverse colon (hepatic                            flexure or splenic flexure)  D50.9, Iron deficiency anemia, unspecified                           K57.30, Diverticulosis of large intestine without                            perforation or abscess without bleeding CPT copyright 2016 American Medical Association. All rights reserved. The codes documented in this report are preliminary and upon coder review may  be revised to meet current compliance requirements. Cristopher Estimable. Anthany Thornhill, MD Norvel Richards, MD 05/05/2016 8:57:21 AM This report has been signed electronically. Number of Addenda: 0

## 2016-05-05 NOTE — Op Note (Signed)
[facility] Patient Name: Bruce Duarte Procedure Date: 05/05/2016 8:55 AM MRN: [id number] Date of Birth: June 09, 1935 Attending MD: Norvel Richards , MD CSN: [id number] Age: 80 Admit Type: Outpatient Procedure:                Upper GI endoscopy Indications:              Iron deficiency anemia Providers:                Norvel Richards, MD, Lurline Del, RN, [facility], Technician Referring MD:              Medicines:                Midazolam 5 mg IV, Meperidine 75 mg IV, Ondansetron                            4 mg IV Complications:            No immediate complications. Estimated Blood Loss:     Estimated blood loss was minimal. Procedure:                Pre-Anesthesia Assessment:                           - Prior to the procedure, a History and Physical                            was performed, and patient medications and                            allergies were reviewed. The patient's tolerance of                            previous anesthesia was also reviewed. The risks                            and benefits of the procedure and the sedation                            options and risks were discussed with the patient.                            All questions were answered, and informed consent                            was obtained. Prior Anticoagulants: The patient has                            taken no previous anticoagulant or antiplatelet                            agents. ASA Grade Assessment: III - A patient with  severe systemic disease. After reviewing the risks                            and benefits, the patient was deemed in                            satisfactory condition to undergo the procedure.                           - Prior to the procedure, a History and Physical                            was performed, and patient medications and                            allergies were reviewed. The  patient's tolerance of                            previous anesthesia was also reviewed. The risks                            and benefits of the procedure and the sedation                            options and risks were discussed with the patient.                            All questions were answered, and informed consent                            was obtained. Prior Anticoagulants: The patient has                            taken no previous anticoagulant or antiplatelet                            agents. ASA Grade Assessment: III - A patient with                            severe systemic disease. After reviewing the risks                            and benefits, the patient was deemed in                            satisfactory condition to undergo the procedure.                           After obtaining informed consent, the endoscope was                            passed under direct vision. Throughout the  procedure, the patient's blood pressure, pulse, and                            oxygen saturations were monitored continuously. The                            [id number] scope was introduced through the                            mouth, and advanced to the second part of duodenum.                            The upper GI endoscopy was accomplished without                            difficulty. The patient tolerated the procedure                            well. Scope In: 8:58:52 AM Scope Out: 9:03:46 AM Total Procedure Duration: 0 hours 4 minutes 54 seconds  Findings:      The examined esophagus was normal.      A few localized 3 mm erosions were found in the gastric antrum. This was       biopsied with a cold forceps for histology. Estimated blood loss was       minimal. Gastric mucosa otherwise normal      The duodenal bulb and second portion of the duodenum were normal. Impression:               - Normal esophagus.                            - Erosive gastropathy. Biopsied.                           - Normal duodenal bulb and second portion of the                            duodenum. Moderate Sedation:      Moderate (conscious) sedation was administered by the endoscopy nurse       and supervised by the endoscopist. The following parameters were       monitored: oxygen saturation, heart rate, blood pressure, respiratory       rate, EKG, adequacy of pulmonary ventilation, and response to care.       Total physician intraservice time was 40 minutes. Recommendation:           - Patient has a contact number available for                            emergencies. The signs and symptoms of potential                            delayed complications were discussed with the                            patient. Return to normal  activities tomorrow.                            Written discharge instructions were provided to the                            patient.                           - Resume previous diet.                           - Continue present medications.                           - Repeat upper endoscopy after studies are complete                            for surveillance based on pathology results.                           - Return to GI office (date not yet determined).                            See colonoscopy report. Procedure Code(s):        --- Professional ---                           [phone], Esophagogastroduodenoscopy, flexible,                            transoral; with biopsy, single or multiple                           99152, Moderate sedation services provided by the                            same physician or other qualified health care                            professional performing the diagnostic or                            therapeutic service that the sedation supports,                            requiring the presence of an independent trained                            observer to assist in the  monitoring of the                            patient's level of consciousness and physiological                            status; initial 15 minutes of intraservice time,  patient age 82 years or older                           [phone], Moderate sedation services; each additional                            15 minutes intraservice time                           [phone], Moderate sedation services; each additional                            15 minutes intraservice time Diagnosis Code(s):        --- Professional ---                           K31.89, Other diseases of stomach and duodenum                           D50.9, Iron deficiency anemia, unspecified CPT copyright 2016 American Medical Association. All rights reserved. The codes documented in this report are preliminary and upon coder review may  be revised to meet current compliance requirements. Cristopher Estimable. Sharah Finnell, MD Norvel Richards, MD 05/05/2016 9:07:56 AM This report has been signed electronically. Number of Addenda: 0

## 2016-05-05 NOTE — Discharge Instructions (Signed)
°Colonoscopy °Discharge Instructions ° °Read the instructions outlined below and refer to this sheet in the next few weeks. These discharge instructions provide you with general information on caring for yourself after you leave the hospital. Your doctor may also give you specific instructions. While your treatment has been planned according to the most current medical practices available, unavoidable complications occasionally occur. If you have any problems or questions after discharge, call Dr. Rourk at [phone]. °ACTIVITY °· You may resume your regular activity, but move at a slower pace for the next 24 hours.  °· Take frequent rest periods for the next 24 hours.  °· Walking will help get rid of the air and reduce the bloated feeling in your belly (abdomen).  °· No driving for 24 hours (because of the medicine (anesthesia) used during the test).   °· Do not sign any important legal documents or operate any machinery for 24 hours (because of the anesthesia used during the test).  °NUTRITION °· Drink plenty of fluids.  °· You may resume your normal diet as instructed by your doctor.  °· Begin with a light meal and progress to your normal diet. Heavy or fried foods are harder to digest and may make you feel sick to your stomach (nauseated).  °· Avoid alcoholic beverages for 24 hours or as instructed.  °MEDICATIONS °· You may resume your normal medications unless your doctor tells you otherwise.  °WHAT YOU CAN EXPECT TODAY °· Some feelings of bloating in the abdomen.  °· Passage of more gas than usual.  °· Spotting of blood in your stool or on the toilet paper.  °IF YOU HAD POLYPS REMOVED DURING THE COLONOSCOPY: °· No aspirin products for 7 days or as instructed.  °· No alcohol for 7 days or as instructed.  °· Eat a soft diet for the next 24 hours.  °FINDING OUT THE RESULTS OF YOUR TEST °Not all test results are available during your visit. If your test results are not back during the visit, make an appointment  with your caregiver to find out the results. Do not assume everything is normal if you have not heard from your caregiver or the medical facility. It is important for you to follow up on all of your test results.  °SEEK IMMEDIATE MEDICAL ATTENTION IF: °· You have more than a spotting of blood in your stool.  °· Your belly is swollen (abdominal distention).  °· You are nauseated or vomiting.  °· You have a temperature over 101.  °· You have abdominal pain or discomfort that is severe or gets worse throughout the day.  °EGD °Discharge instructions °Please read the instructions outlined below and refer to this sheet in the next few weeks. These discharge instructions provide you with general information on caring for yourself after you leave the hospital. Your doctor may also give you specific instructions. While your treatment has been planned according to the most current medical practices available, unavoidable complications occasionally occur. If you have any problems or questions after discharge, please call your doctor. °ACTIVITY °· You may resume your regular activity but move at a slower pace for the next 24 hours.  °· Take frequent rest periods for the next 24 hours.  °· Walking will help expel (get rid of) the air and reduce the bloated feeling in your abdomen.  °· No driving for 24 hours (because of the anesthesia (medicine) used during the test).  °· You may shower.  °· Do not sign any important   legal documents or operate any machinery for 24 hours (because of the anesthesia used during the test).  NUTRITION  Drink plenty of fluids.   You may resume your normal diet.   Begin with a light meal and progress to your normal diet.   Avoid alcoholic beverages for 24 hours or as instructed by your caregiver.  MEDICATIONS  You may resume your normal medications unless your caregiver tells you otherwise.  WHAT YOU CAN EXPECT TODAY  You may experience abdominal discomfort such as a feeling of fullness  or gas pains.  FOLLOW-UP  Your doctor will discuss the results of your test with you.  SEEK IMMEDIATE MEDICAL ATTENTION IF ANY OF THE FOLLOWING OCCUR:  Excessive nausea (feeling sick to your stomach) and/or vomiting.   Severe abdominal pain and distention (swelling).   Trouble swallowing.   Temperature over 101 F (37.8 C).   Rectal bleeding or vomiting of blood.     Colon polyp and diverticulosis information provided  Further recommendations to follow pending review of pathology     Colon Polyps Introduction Polyps are tissue growths inside the body. Polyps can grow in many places, including the large intestine (colon). A polyp may be a round bump or a mushroom-shaped growth. You could have one polyp or several. Most colon polyps are noncancerous (benign). However, some colon polyps can become cancerous over time. What are the causes? The exact cause of colon polyps is not known. What increases the risk? This condition is more likely to develop in people who:  Have a family history of colon cancer or colon polyps.  Are older than 36 or older than 45 if they are African American.  Have inflammatory bowel disease, such as ulcerative colitis or Crohn disease.  Are overweight.  Smoke cigarettes.  Do not get enough exercise.  Drink too much alcohol.  Eat a diet that is:  High in fat and red meat.  Low in fiber.  Had childhood cancer that was treated with abdominal radiation. What are the signs or symptoms? Most polyps do not cause symptoms. If you have symptoms, they may include:  Blood coming from your rectum when having a bowel movement.  Blood in your stool.The stool may look dark red or black.  A change in bowel habits, such as constipation or diarrhea. How is this diagnosed? This condition is diagnosed with a colonoscopy. This is a procedure that uses a lighted, flexible scope to look at the inside of your colon. How is this treated? Treatment  for this condition involves removing any polyps that are found. Those polyps will then be tested for cancer. If cancer is found, your health care provider will talk to you about options for colon cancer treatment. Follow these instructions at home: Diet  Eat plenty of fiber, such as fruits, vegetables, and whole grains.  Eat foods that are high in calcium and vitamin D, such as milk, cheese, yogurt, eggs, liver, fish, and broccoli.  Limit foods high in fat, red meats, and processed meats, such as hot dogs, sausage, bacon, and lunch meats.  Maintain a healthy weight, or lose weight if recommended by your health care provider. General instructions  Do not smoke cigarettes.  Do not drink alcohol excessively.  Keep all follow-up visits as told by your health care provider. This is important. This includes keeping regularly scheduled colonoscopies. Talk to your health care provider about when you need a colonoscopy.  Exercise every day or as told by your health care provider.  Contact a health care provider if:  You have new or worsening bleeding during a bowel movement.  You have new or increased blood in your stool.  You have a change in bowel habits.  You unexpectedly lose weight. This information is not intended to replace advice given to you by your health care provider. Make sure you discuss any questions you have with your health care provider. Document Released: 02/11/2004 Document Revised: 10/23/2015 Document Reviewed: 04/07/2015  2017 Elsevier  Diverticulosis Diverticulosis is the condition that develops when small pouches (diverticula) form in the wall of your colon. Your colon, or large intestine, is where water is absorbed and stool is formed. The pouches form when the inside layer of your colon pushes through weak spots in the outer layers of your colon. CAUSES  No one knows exactly what causes diverticulosis. RISK FACTORS  Being older than 45. Your risk for this  condition increases with age. Diverticulosis is rare in people younger than 40 years. By age 68, almost everyone has it.  Eating a low-fiber diet.  Being frequently constipated.  Being overweight.  Not getting enough exercise.  Smoking.  Taking over-the-counter pain medicines, like aspirin and ibuprofen. SYMPTOMS  Most people with diverticulosis do not have symptoms. DIAGNOSIS  Because diverticulosis often has no symptoms, health care providers often discover the condition during an exam for other colon problems. In many cases, a health care provider will diagnose diverticulosis while using a flexible scope to examine the colon (colonoscopy). TREATMENT  If you have never developed an infection related to diverticulosis, you may not need treatment. If you have had an infection before, treatment may include:  Eating more fruits, vegetables, and grains.  Taking a fiber supplement.  Taking a live bacteria supplement (probiotic).  Taking medicine to relax your colon. HOME CARE INSTRUCTIONS   Drink at least 6-8 glasses of water each day to prevent constipation.  Try not to strain when you have a bowel movement.  Keep all follow-up appointments. If you have had an infection before:  Increase the fiber in your diet as directed by your health care provider or dietitian.  Take a dietary fiber supplement if your health care provider approves.  Only take medicines as directed by your health care provider. SEEK MEDICAL CARE IF:   You have abdominal pain.  You have bloating.  You have cramps.  You have not gone to the bathroom in 3 days. SEEK IMMEDIATE MEDICAL CARE IF:   Your pain gets worse.  Yourbloating becomes very bad.  You have a fever or chills, and your symptoms suddenly get worse.  You begin vomiting.  You have bowel movements that are bloody or black. MAKE SURE YOU:  Understand these instructions.  Will watch your condition.  Will get help right away  if you are not doing well or get worse. This information is not intended to replace advice given to you by your health care provider. Make sure you discuss any questions you have with your health care provider. Document Released: 02/12/2004 Document Revised: 05/22/2013 Document Reviewed: 04/11/2013 Elsevier Interactive Patient Education  2017 [employer].

## 2016-05-05 NOTE — H&P (View-Only) (Signed)
Primary Care Physician:  Elyn Peers, MD  Primary Gastroenterologist:  Garfield Cornea, MD   Chief Complaint  Patient presents with  . Colonoscopy    last one 5 yrs ago, hx prostate cancer 2 yrs ago  . Anemia    HPI:  Bruce Duarte is a 80 y.o. male here For further evaluation of microcytic anemia and request for colonoscopy. Patient was noted to have new microcytic anemia back in June as outlined below. At this time I do not have his most recent labs but they have been requested. Patient reports his last colonoscopy was about 5 years ago at [facility]. He says he has a history of polyps. He does say he had EGD too for GERD.  Overall feels well. No bowel issues. Denies blood in the stool or melena. Manages his reflux with dietary measures. Does not require daily medication. Denies frequent NSAID use. Takes aspirin 81 mg daily. Denies vomiting, dysphagia, abdominal pain. No weight loss. History of prostate cancer 2 years ago.   Current Outpatient Prescriptions  Medication Sig Dispense Refill  . aspirin EC 81 MG tablet Take 81 mg by mouth daily.    . Cholecalciferol (VITAMIN D3) 1000 UNITS CAPS Take by mouth daily.     Marland Kitchen docusate sodium (COLACE) 100 MG capsule Take 200 mg by mouth daily.    Marland Kitchen losartan-hydrochlorothiazide (HYZAAR) 100-25 MG per tablet Take 1 tablet by mouth every morning.     . metFORMIN (GLUMETZA) 500 MG (MOD) 24 hr tablet Take 500 mg by mouth at bedtime.     . Misc Natural Products (GINSENG COMPLEX PO) Take 1 capsule by mouth daily.     . Multiple Vitamins-Minerals (MULTIVITAMIN GUMMIES MENS PO) Take 1 tablet by mouth daily.    . tamsulosin (FLOMAX) 0.4 MG CAPS capsule Take 0.4 mg by mouth daily.    . vitamin E 400 UNIT capsule Take 400 Units by mouth daily.     No current facility-administered medications for this visit.     Allergies as of 04/07/2016  . (No Known Allergies)    Past Medical History:  Diagnosis Date  . Arthritis   . At risk for  sleep apnea    STOP-BANG= 5   SENT TO PCP 01-22-2014  . Borderline type 2 diabetes mellitus   . Hyperlipidemia   . Hypertension   . Prostate cancer ([place]) DX  2013   Gleason 3+3=6, vol 42.34 cc  . Wears glasses     Past Surgical History:  Procedure Laterality Date  . APPENDECTOMY  age 46  . COLONOSCOPY W/ BIOPSIES  2009   non-cancerous  . PROSTATE BIOPSY  11/05/13   adenocarcinoma, gleason 6  . RADIOACTIVE SEED IMPLANT N/A 01/29/2014   Procedure: RADIOACTIVE SEED IMPLANT;  Surgeon: Arvil Persons, MD;  Location: [facility];  Service: Urology;  Laterality: N/A;     seeds implanted no seeds found in bladder    Family History  Problem Relation Age of Onset  . Diabetes Mother   . Stroke Father   . Cancer Cousin     prostate  . Colon cancer Neg Hx     Social History   Social History  . Marital status: Married    Spouse name: N/A  . Number of children: N/A  . Years of education: N/A   Occupational History  . Not on file.   Social History Main Topics  . Smoking status: Former Smoker    Packs/day: 1.00  Years: 25.00    Types: Cigarettes, Pipe, Cigars    Quit date: 12/04/1973  . Smokeless tobacco: Never Used  . Alcohol use Yes     Comment: occasionally brandy  . Drug use: No  . Sexual activity: Not on file   Other Topics Concern  . Not on file   Social History Narrative  . No narrative on file      ROS:  General: Negative for anorexia, weight loss, fever, chills, fatigue, weakness. Eyes: Negative for vision changes.  ENT: Negative for hoarseness, difficulty swallowing , nasal congestion. CV: Negative for chest pain, angina, palpitations, dyspnea on exertion, peripheral edema.  Respiratory: Negative for dyspnea at rest, dyspnea on exertion, cough, sputum, wheezing.  GI: See history of present illness. GU:  Negative for dysuria, hematuria, urinary incontinence, urinary frequency, nocturnal urination.  MS: Negative for joint pain, low back pain.   Derm: Negative for rash or itching.  Neuro: Negative for weakness, abnormal sensation, seizure, frequent headaches, memory loss, confusion.  Psych: Negative for anxiety, depression, suicidal ideation, hallucinations.  Endo: Negative for unusual weight change.  Heme: Negative for bruising or bleeding. Allergy: Negative for rash or hives.    Physical Examination:  BP 130/74   Pulse 90   Temp 97.8 F (36.6 C) (Oral)   Ht 5\' 9"  (1.753 m)   Wt 220 lb 9.6 oz (100.1 kg)   BMI 32.58 kg/m    General: Well-nourished, well-developed in no acute distress.  Head: Normocephalic, atraumatic.   Eyes: Conjunctiva pink, no icterus. Mouth: Oropharyngeal mucosa moist and pink , no lesions erythema or exudate. Neck: Supple without thyromegaly, masses, or lymphadenopathy.  Lungs: Clear to auscultation bilaterally.  Heart: Regular rate and rhythm, no murmurs rubs or gallops.  Abdomen: Bowel sounds are normal, nontender, nondistended, no hepatosplenomegaly or masses, no abdominal bruits or    hernia , no rebound or guarding.   Rectal: Deferred Extremities: No lower extremity edema. No clubbing or deformities.  Neuro: Alert and oriented x 4 , grossly normal neurologically.  Skin: Warm and dry, no rash or jaundice.   Psych: Alert and cooperative, normal mood and affect.  Labs: Labs from June 2017 Sodium 137, potassium 3.7, BUN 14, creatinine 1.02, total bilirubin 0.3, alkaline phosphatase 43, AST 28, ALT 22, albumin 4.2, white blood cell count 3500, hemoglobin 11.1, hematocrit 35.8, MCV 75.7, platelets 311,000. Hemoglobin A1c 6.3.  Imaging Studies: No results found.

## 2016-05-07 ENCOUNTER — Encounter (HOSPITAL_COMMUNITY): Payer: Self-pay | Admitting: Internal Medicine

## 2016-05-11 ENCOUNTER — Encounter: Payer: Self-pay | Admitting: Internal Medicine

## 2016-05-27 ENCOUNTER — Telehealth: Payer: Self-pay

## 2016-05-27 ENCOUNTER — Encounter: Payer: Self-pay | Admitting: Internal Medicine

## 2016-05-27 NOTE — Telephone Encounter (Signed)
Per RMR- Send letter to patient.  Send copy of letter with path to referring provider and PCP.   Probably needs an ov w extender in next several weeks if not already scheduled regarding IDA

## 2016-05-27 NOTE — Telephone Encounter (Signed)
APPT MADE AND LETTER SENT  °

## 2016-05-27 NOTE — Telephone Encounter (Signed)
Letter mailed to the pt. Please schedule ov. 

## 2016-06-16 ENCOUNTER — Encounter: Payer: Self-pay | Admitting: Gastroenterology

## 2016-06-18 ENCOUNTER — Ambulatory Visit: Payer: Medicare Other | Admitting: Gastroenterology

## 2016-06-30 ENCOUNTER — Encounter: Payer: Self-pay | Admitting: Internal Medicine

## 2016-07-08 ENCOUNTER — Ambulatory Visit: Payer: Medicare Other | Admitting: Gastroenterology

## 2016-07-12 ENCOUNTER — Ambulatory Visit: Payer: Medicare Other | Admitting: Gastroenterology

## 2016-07-27 ENCOUNTER — Ambulatory Visit (INDEPENDENT_AMBULATORY_CARE_PROVIDER_SITE_OTHER): Payer: Medicare Other | Admitting: Gastroenterology

## 2016-07-27 ENCOUNTER — Encounter: Payer: Self-pay | Admitting: Gastroenterology

## 2016-07-27 DIAGNOSIS — D509 Iron deficiency anemia, unspecified: Secondary | ICD-10-CM | POA: Insufficient documentation

## 2016-07-27 HISTORY — DX: Iron deficiency anemia, unspecified: D50.9

## 2016-07-27 NOTE — Patient Instructions (Signed)
1. Please have your labs done. We will contact you with results as available.  

## 2016-07-27 NOTE — Assessment & Plan Note (Signed)
Update CBC, iron, ferritin. If persistent IDA, will consider small bowel endoscopy to wrap up GI work up of Bruce Duarte.

## 2016-07-27 NOTE — Progress Notes (Signed)
cc'ed to pcp °

## 2016-07-27 NOTE — Progress Notes (Signed)
      Primary Care Physician: Elyn Peers, MD  Primary Gastroenterologist:  Garfield Cornea, MD   Chief Complaint  Patient presents with  . Anemia    doing ok    HPI: Bruce Duarte is a 81 y.o. male here for f/u IDA. He had TCS/EGD in 04/2016. 52mm tubular adenoma removed from splenic flexure, multiple diverticula. No future colonoscopies recommended for screening/surveillance purposes, egd with few 66mm antral erosions, benign bx with no h.pylori. Clinically patient feels good. Negative GI ROS.   Has labs with his urologist next month, Dr. Alyson Ingles.    Current Outpatient Prescriptions  Medication Sig Dispense Refill  . aspirin EC 81 MG tablet Take 81 mg by mouth daily.    Marland Kitchen docusate sodium (COLACE) 100 MG capsule Take 200 mg by mouth daily.    Vanessa Kick Ethyl (VASCEPA) 1 g CAPS Take 1 capsule by mouth 2 (two) times daily.    Marland Kitchen losartan-hydrochlorothiazide (HYZAAR) 100-25 MG per tablet Take 1 tablet by mouth every morning.     . metFORMIN (GLUCOPHAGE) 500 MG tablet Take 2 tablets by mouth daily.    . Misc Natural Products (GINSENG COMPLEX PO) Take 1 capsule by mouth daily.     . Multiple Vitamins-Minerals (MULTIVITAMIN GUMMIES MENS PO) Take 1 tablet by mouth daily.    . tamsulosin (FLOMAX) 0.4 MG CAPS capsule Take 0.4 mg by mouth daily.     No current facility-administered medications for this visit.     Allergies as of 07/27/2016  . (No Known Allergies)    ROS:  General: Negative for anorexia, weight loss, fever, chills, fatigue, weakness. ENT: Negative for hoarseness, difficulty swallowing , nasal congestion. CV: Negative for chest pain, angina, palpitations, dyspnea on exertion, peripheral edema.  Respiratory: Negative for dyspnea at rest, dyspnea on exertion, cough, sputum, wheezing.  GI: See history of present illness. GU:  Negative for dysuria, hematuria, urinary incontinence, urinary frequency, nocturnal urination.  Endo: Negative for unusual weight change.    Physical Examination:   BP (!) 146/71   Pulse 76   Temp 98.1 F (36.7 C) (Oral)   Ht 5\' 9"  (1.753 m)   Wt 218 lb 12.8 oz (99.2 kg)   BMI 32.31 kg/m   General: Well-nourished, well-developed in no acute distress.  Eyes: No icterus. Mouth: Oropharyngeal mucosa moist and pink , no lesions erythema or exudate. Lungs: Clear to auscultation bilaterally.  Heart: Regular rate and rhythm, no murmurs rubs or gallops.  Abdomen: Bowel sounds are normal, nontender, nondistended, no hepatosplenomegaly or masses, no abdominal bruits or hernia , no rebound or guarding.   Extremities: No lower extremity edema. No clubbing or deformities. Neuro: Alert and oriented x 4   Skin: Warm and dry, no jaundice.   Psych: Alert and cooperative, normal mood and affect.

## 2016-08-09 DIAGNOSIS — D509 Iron deficiency anemia, unspecified: Secondary | ICD-10-CM | POA: Diagnosis not present

## 2016-08-09 DIAGNOSIS — C61 Malignant neoplasm of prostate: Secondary | ICD-10-CM | POA: Diagnosis not present

## 2016-08-09 LAB — CBC WITH DIFFERENTIAL/PLATELET
BASOS PCT: 1 %
Basophils Absolute: 35 cells/uL (ref 0–200)
EOS PCT: 4 %
Eosinophils Absolute: 140 cells/uL (ref 15–500)
HCT: 39.4 % (ref 38.5–50.0)
HEMOGLOBIN: 12.4 g/dL — AB (ref 13.2–17.1)
LYMPHS ABS: 1435 {cells}/uL (ref 850–3900)
Lymphocytes Relative: 41 %
MCH: 25 pg — ABNORMAL LOW (ref 27.0–33.0)
MCHC: 31.5 g/dL — ABNORMAL LOW (ref 32.0–36.0)
MCV: 79.4 fL — ABNORMAL LOW (ref 80.0–100.0)
MONOS PCT: 10 %
MPV: 9.2 fL (ref 7.5–12.5)
Monocytes Absolute: 350 cells/uL (ref 200–950)
Neutro Abs: 1540 cells/uL (ref 1500–7800)
Neutrophils Relative %: 44 %
PLATELETS: 309 10*3/uL (ref 140–400)
RBC: 4.96 MIL/uL (ref 4.20–5.80)
RDW: 19.9 % — ABNORMAL HIGH (ref 11.0–15.0)
WBC: 3.5 10*3/uL — ABNORMAL LOW (ref 3.8–10.8)

## 2016-08-09 LAB — IRON AND TIBC
%SAT: 11 % — AB (ref 15–60)
IRON: 39 ug/dL — AB (ref 50–180)
TIBC: 364 ug/dL (ref 250–425)
UIBC: 325 ug/dL (ref 125–400)

## 2016-08-09 LAB — FERRITIN: FERRITIN: 13 ng/mL — AB (ref 20–380)

## 2016-08-11 NOTE — Progress Notes (Signed)
Persistent ida although h/h slightly improved.  To complete gi work up we should offer him small bowel capsule endoscopy.

## 2016-08-12 ENCOUNTER — Other Ambulatory Visit: Payer: Self-pay

## 2016-08-12 DIAGNOSIS — D509 Iron deficiency anemia, unspecified: Secondary | ICD-10-CM

## 2016-08-18 ENCOUNTER — Ambulatory Visit (INDEPENDENT_AMBULATORY_CARE_PROVIDER_SITE_OTHER): Payer: Medicare Other | Admitting: Urology

## 2016-08-18 DIAGNOSIS — N401 Enlarged prostate with lower urinary tract symptoms: Secondary | ICD-10-CM

## 2016-08-18 DIAGNOSIS — N5201 Erectile dysfunction due to arterial insufficiency: Secondary | ICD-10-CM

## 2016-08-18 DIAGNOSIS — C61 Malignant neoplasm of prostate: Secondary | ICD-10-CM

## 2016-08-26 ENCOUNTER — Other Ambulatory Visit: Payer: Self-pay | Admitting: Gastroenterology

## 2016-08-26 DIAGNOSIS — D509 Iron deficiency anemia, unspecified: Secondary | ICD-10-CM

## 2016-08-26 NOTE — Progress Notes (Signed)
Let's also recheck his CBC, ferritin in 4 months.

## 2016-09-06 ENCOUNTER — Ambulatory Visit (HOSPITAL_COMMUNITY)
Admission: RE | Admit: 2016-09-06 | Discharge: 2016-09-06 | Disposition: A | Discharge status: Home / Self Care | Payer: Medicare Other | Source: Ambulatory Visit | Attending: Internal Medicine | Admitting: Internal Medicine

## 2016-09-06 ENCOUNTER — Encounter (HOSPITAL_COMMUNITY)
Admission: RE | Disposition: A | Discharge status: Home / Self Care | Payer: Self-pay | Source: Ambulatory Visit | Attending: Internal Medicine

## 2016-09-06 DIAGNOSIS — D508 Other iron deficiency anemias: Secondary | ICD-10-CM | POA: Diagnosis not present

## 2016-09-06 DIAGNOSIS — K3189 Other diseases of stomach and duodenum: Secondary | ICD-10-CM | POA: Insufficient documentation

## 2016-09-06 DIAGNOSIS — D509 Iron deficiency anemia, unspecified: Secondary | ICD-10-CM | POA: Insufficient documentation

## 2016-09-06 HISTORY — PX: GIVENS CAPSULE STUDY: SHX5432

## 2016-09-06 SURGERY — IMAGING PROCEDURE, GI TRACT, INTRALUMINAL, VIA CAPSULE

## 2016-09-07 ENCOUNTER — Encounter (HOSPITAL_COMMUNITY): Payer: Self-pay | Admitting: Internal Medicine

## 2016-09-09 ENCOUNTER — Encounter (HOSPITAL_COMMUNITY): Payer: Self-pay | Admitting: Internal Medicine

## 2016-09-10 ENCOUNTER — Telehealth: Payer: Self-pay | Admitting: Gastroenterology

## 2016-09-10 NOTE — Telephone Encounter (Signed)
Capsule study had some scattered erosions. Does he take NSAIDs or aspirin powders? Needs to avoid this. There is one area that looks like it may be a benign variant, but I am talking to Dr. Gala Romney more about it. No need for concern: will review that image and get back with him shortly if anything further needs to be done (such as imaging).   Need to start taking iron 325 mg po BID. I recommend IV iron but needs to be established with hematology first to have full labs done. We have investigated from a GI standpoint.   Please refer to hematology and have him return here in 3 months.

## 2016-09-10 NOTE — Procedures (Signed)
Small Bowel [place] Capsule Study Procedure date:  09/06/16  Referring Provider:  Dr. Gala Romney  PCP:  Dr. Elyn Peers, MD  Indication for procedure:   81 year old male with persistent IDA, recent colonoscopy/EGD on file. No overt bleeding, with capsule study indicated to conclude GI evaluation. Most recent Hgb 12.4, iron 39, ferritin 13. Ferritin, Hgb improved from Oct 2017 but still notably iron deficient.    Findings:   Capsule study complete to the cecum. Known erosions in stomach. No overt protruding lesions noted. Approximately midway through small bowel at 02:18:23, possible submucosal raised area of small bowel vs extrinsic compression vs normal small bowel peristalsis, no concerning features at site of interest. Scattered few lymphangiectasis in proximal small bowel. Scattered erosions without obvious ulcerations in small bowel.   First Gastric image:  00:02:05 First Duodenal image: 00:54:13 First Cecal image: 03:39:30 Gastric Passage time: 0h 84m Small Bowel Passage time:  2h 66m  Summary & Recommendations: 81 year old male with IDA, recent colonoscopy/EGD without significant findings other than adenoma and few antral erosions, negative H.pylori. Capsule study with scattered erosions but without outright ulceration. Area of question mid-way through small bowel may be simply peristalsis but unable to exclude submucosal or extrinsic etiology. Will need to review this image further.   In interim, needs to avoid all NSAIDs, aspirin powders, and proceed with hematology referral. Will likely need parenteral iron due to significantly low ferritin. Will start on oral iron BID for now while waiting for hematology referral.   Further recommendations to follow after final review of images.   Annitta Needs, ANP-BC [facility]   Attending note:  Pertinent images reviewed. No significant findings to explain Iron  Deficiency anemia.  Agree with hematology referral.

## 2016-09-13 ENCOUNTER — Other Ambulatory Visit: Payer: Self-pay

## 2016-09-13 DIAGNOSIS — D509 Iron deficiency anemia, unspecified: Secondary | ICD-10-CM

## 2016-09-13 NOTE — Telephone Encounter (Signed)
Tried to call pt- NA- LMOM 

## 2016-09-13 NOTE — Telephone Encounter (Signed)
Referral sent to Hematology via Epic. 

## 2016-09-14 NOTE — Telephone Encounter (Signed)
No further work-up regarding the area that I identified and questioned if benign variant. Does not appear concerning. Continue with hematology as planned.

## 2016-09-15 ENCOUNTER — Encounter: Payer: Self-pay | Admitting: Internal Medicine

## 2016-09-15 NOTE — Telephone Encounter (Signed)
Please schedule ov here in 3 months.

## 2016-09-15 NOTE — Telephone Encounter (Signed)
Office visit made and letter sent to patient

## 2016-09-15 NOTE — Telephone Encounter (Signed)
Pt is aware of results. He is aware to get iron. He already has appt to hematology.

## 2016-09-29 ENCOUNTER — Encounter (HOSPITAL_COMMUNITY): Payer: Medicare Other

## 2016-09-29 ENCOUNTER — Encounter (HOSPITAL_COMMUNITY): Payer: Medicare Other | Attending: Oncology | Admitting: Oncology

## 2016-09-29 ENCOUNTER — Encounter (HOSPITAL_COMMUNITY): Payer: Self-pay | Admitting: Oncology

## 2016-09-29 VITALS — BP 142/72 | HR 84 | Temp 98.1°F | Resp 18 | Wt 221.8 lb

## 2016-09-29 DIAGNOSIS — E785 Hyperlipidemia, unspecified: Secondary | ICD-10-CM | POA: Diagnosis not present

## 2016-09-29 DIAGNOSIS — Z79899 Other long term (current) drug therapy: Secondary | ICD-10-CM | POA: Insufficient documentation

## 2016-09-29 DIAGNOSIS — Z823 Family history of stroke: Secondary | ICD-10-CM | POA: Insufficient documentation

## 2016-09-29 DIAGNOSIS — D500 Iron deficiency anemia secondary to blood loss (chronic): Secondary | ICD-10-CM

## 2016-09-29 DIAGNOSIS — Z7982 Long term (current) use of aspirin: Secondary | ICD-10-CM | POA: Insufficient documentation

## 2016-09-29 DIAGNOSIS — Z8042 Family history of malignant neoplasm of prostate: Secondary | ICD-10-CM

## 2016-09-29 DIAGNOSIS — Z8546 Personal history of malignant neoplasm of prostate: Secondary | ICD-10-CM | POA: Insufficient documentation

## 2016-09-29 DIAGNOSIS — D509 Iron deficiency anemia, unspecified: Secondary | ICD-10-CM | POA: Diagnosis not present

## 2016-09-29 DIAGNOSIS — C61 Malignant neoplasm of prostate: Secondary | ICD-10-CM

## 2016-09-29 DIAGNOSIS — Z7984 Long term (current) use of oral hypoglycemic drugs: Secondary | ICD-10-CM | POA: Diagnosis not present

## 2016-09-29 DIAGNOSIS — Z87891 Personal history of nicotine dependence: Secondary | ICD-10-CM

## 2016-09-29 DIAGNOSIS — Z9889 Other specified postprocedural states: Secondary | ICD-10-CM | POA: Insufficient documentation

## 2016-09-29 DIAGNOSIS — Z833 Family history of diabetes mellitus: Secondary | ICD-10-CM | POA: Insufficient documentation

## 2016-09-29 DIAGNOSIS — I10 Essential (primary) hypertension: Secondary | ICD-10-CM

## 2016-09-29 LAB — FERRITIN: FERRITIN: 10 ng/mL — AB (ref 24–336)

## 2016-09-29 LAB — RETICULOCYTES
RBC.: 4.78 MIL/uL (ref 4.22–5.81)
RETIC CT PCT: 1.2 % (ref 0.4–3.1)
Retic Count, Absolute: 57.4 10*3/uL (ref 19.0–186.0)

## 2016-09-29 LAB — CBC WITH DIFFERENTIAL/PLATELET
BASOS ABS: 0 10*3/uL (ref 0.0–0.1)
BASOS PCT: 1 %
EOS PCT: 3 %
Eosinophils Absolute: 0.1 10*3/uL (ref 0.0–0.7)
HCT: 38.5 % — ABNORMAL LOW (ref 39.0–52.0)
Hemoglobin: 12.3 g/dL — ABNORMAL LOW (ref 13.0–17.0)
LYMPHS PCT: 31 %
Lymphs Abs: 1.1 10*3/uL (ref 0.7–4.0)
MCH: 25.7 pg — ABNORMAL LOW (ref 26.0–34.0)
MCHC: 31.9 g/dL (ref 30.0–36.0)
MCV: 80.5 fL (ref 78.0–100.0)
MONO ABS: 0.3 10*3/uL (ref 0.1–1.0)
Monocytes Relative: 9 %
Neutro Abs: 2 10*3/uL (ref 1.7–7.7)
Neutrophils Relative %: 56 %
PLATELETS: 256 10*3/uL (ref 150–400)
RBC: 4.78 MIL/uL (ref 4.22–5.81)
RDW: 18.3 % — AB (ref 11.5–15.5)
WBC: 3.6 10*3/uL — ABNORMAL LOW (ref 4.0–10.5)

## 2016-09-29 LAB — COMPREHENSIVE METABOLIC PANEL
ALT: 27 U/L (ref 17–63)
ANION GAP: 7 (ref 5–15)
AST: 35 U/L (ref 15–41)
Albumin: 4.2 g/dL (ref 3.5–5.0)
Alkaline Phosphatase: 52 U/L (ref 38–126)
BUN: 13 mg/dL (ref 6–20)
CHLORIDE: 103 mmol/L (ref 101–111)
CO2: 28 mmol/L (ref 22–32)
Calcium: 9.4 mg/dL (ref 8.9–10.3)
Creatinine, Ser: 1.13 mg/dL (ref 0.61–1.24)
GFR calc Af Amer: >60 mL/min (ref >60)
Glucose, Bld: 91 mg/dL (ref 65–99)
POTASSIUM: 3.3 mmol/L — AB (ref 3.5–5.1)
Sodium: 138 mmol/L (ref 135–145)
Total Bilirubin: 0.5 mg/dL (ref 0.3–1.2)
Total Protein: 7.4 g/dL (ref 6.5–8.1)

## 2016-09-29 LAB — IRON AND TIBC
Iron: 36 ug/dL — ABNORMAL LOW (ref 45–182)
SATURATION RATIOS: 9 % — AB (ref 17.9–39.5)
TIBC: 416 ug/dL (ref 250–450)
UIBC: 380 ug/dL

## 2016-09-29 LAB — FOLATE: Folate: 8.6 ng/mL (ref >5.9)

## 2016-09-29 LAB — LACTATE DEHYDROGENASE: LDH: 203 U/L — AB (ref 98–192)

## 2016-09-29 LAB — VITAMIN B12: VITAMIN B 12: 396 pg/mL (ref 180–914)

## 2016-09-29 NOTE — Patient Instructions (Addendum)
[place] at [facility] Discharge Instructions  RECOMMENDATIONS MADE BY THE CONSULTANT AND ANY TEST RESULTS WILL BE SENT TO YOUR REFERRING PHYSICIAN.  You were seen today by Kirby Crigler PA-C. Labs today, we will call you with the results. Continue iron unless otherwise directed. Return in 6 weeks for labs and follow up.   Thank you for choosing [place] at [facility] to provide your oncology and hematology care.  To afford each patient quality time with our provider, please arrive at least 15 minutes before your scheduled appointment time.    If you have a lab appointment with the [facility] please come in thru the  Main Entrance and check in at the main information desk  You need to re-schedule your appointment should you arrive 10 or more minutes late.  We strive to give you quality time with our providers, and arriving late affects you and other patients whose appointments are after yours.  Also, if you no show three or more times for appointments you may be dismissed from the clinic at the providers discretion.     Again, thank you for choosing [facility].  Our hope is that these requests will decrease the amount of time that you wait before being seen by our physicians.       _____________________________________________________________  Should you have questions after your visit to [facility], please contact our office at [phone] between the hours of 8:30 a.m. and 4:30 p.m.  Voicemails left after 4:30 p.m. will not be returned until the following business day.  For prescription refill requests, have your pharmacy contact our office.       Resources For Cancer Patients and their Caregivers ? American Cancer Society: Can assist with transportation, wigs, general needs, runs Look Good Feel Better.        (626) 482-7654 ? Cancer Care: Provides financial assistance, online support groups,  medication/co-pay assistance.  1-800-813-HOPE (330)140-6992) ? [place] Assists [place] cancer patients and their families through emotional , educational and financial support.  250-833-6245 ? [place] DSS Where to apply for food stamps, Medicaid and utility assistance. 310 624 4923 ? [employer]: Transportation to medical appointments. 260 199 0027 ? Social Security Administration: May apply for disability if have a Stage IV cancer. 548-319-1356 (574)040-1886 ? [employer], Disability and Transit Services: Assists with nutrition, care and transit needs. [place] Support Programs: @10RELATIVEDAYS @ > Cancer Support Group  2nd Tuesday of the month 1pm-2pm, Journey Room  > Creative Journey  3rd Tuesday of the month 1130am-1pm, Journey Room  > Look Good Feel Better  1st Wednesday of the month 10am-12 noon, Journey Room (Call [place] to register [phone])

## 2016-09-29 NOTE — Progress Notes (Signed)
[facility] Hematology/Oncology Consultation   Name: Bruce Duarte      MRN: [id number]    Location: Room/bed info not found  Date: 09/29/2016 Time:3:50 PM   REFERRING PHYSICIAN:  Roseanne Kaufman, NP (GI)  REASON FOR CONSULT:  Iron deficiency anemia   DIAGNOSIS:  Iron deficiency anemia with ferritin of 13, microcytic, hypochromic.  HISTORY OF PRESENT ILLNESS:   Bruce Duarte is a 81 y.o. male with a medical history significant for history of prostate cancer, dyslipidemia, hypertension who is referred to the [facility] for iron deficiency anemia.  He has undergone a complete GI work-up.  EGD/Colonoscopy by Dr. Gala Romney on 05/05/2016 demonstrating gastric antrum erosions, diverticulosis in sigmoid and descending colon with a single 5 mm polyp at splenic flexure that was removed.  Pathology is negative for malignancy/dysplasia.  S/P Camera Capsule study on 09/06/2016 negative for any source of anemia.  He reports being notified of his anemia by his primary care physician.  He was subsequently referred to GI for workup of his anemia.  As mentioned above, GI workup is complete.  He is referred to [place] from GI for his iron deficiency anemia.  He reports one episode of black stools in the distant past.  He notes that this was approximately around the time in which she was diagnosed by his primary care doctor with anemia.  He denies any blood in his stools, black stools hematuria, hemoptysis, or any other blood loss.    HPI Elements Iron deficiency  Location: Blood  Quality: Microcytic, hypochromic  Severity: Minimal  Duration: Since March 2018  Context: In the setting of gastric erosions (erosive gastritis)  Timing:   Modifying Factors:   Associated Signs & Symptoms: Episode of black stools in the past (patient reported).   He started taking an OTC iron pill 2 weeks ago.  He is taking 1 tablet per day.   Review of Systems  Constitutional: Negative.  Negative  for chills, fever and weight loss.  HENT: Negative.   Eyes: Negative.   Respiratory: Negative.  Negative for cough.   Cardiovascular: Negative.  Negative for chest pain.  Gastrointestinal: Negative.  Negative for blood in stool, constipation, diarrhea, melena, nausea and vomiting.  Genitourinary: Negative.   Musculoskeletal: Negative.   Skin: Negative.   Neurological: Negative.  Negative for weakness.  Endo/Heme/Allergies: Negative.   Psychiatric/Behavioral: Negative.      PAST MEDICAL HISTORY:   Past Medical History:  Diagnosis Date  . Arthritis   . At risk for sleep apnea    STOP-BANG= 5   SENT TO PCP 01-22-2014  . Borderline type 2 diabetes mellitus   . Hyperlipidemia   . Hypertension   . IDA (iron deficiency anemia) 07/27/2016  . Malignant neoplasm of prostate ([place]) 12/04/2013  . Prostate cancer ([place]) DX  2013   Gleason 3+3=6, vol 42.34 cc  . Wears glasses     ALLERGIES: No Known Allergies    MEDICATIONS: I have reviewed the patient's current medications.    Current Outpatient Prescriptions on File Prior to Visit  Medication Sig Dispense Refill  . aspirin EC 81 MG tablet Take 81 mg by mouth daily.    Marland Kitchen docusate sodium (COLACE) 100 MG capsule Take 200 mg by mouth daily.    Vanessa Kick Ethyl (VASCEPA) 1 g CAPS Take 1 capsule by mouth 2 (two) times daily.    Marland Kitchen losartan-hydrochlorothiazide (HYZAAR) 100-25 MG per tablet Take 1 tablet by  mouth every morning.     . metFORMIN (GLUCOPHAGE) 500 MG tablet Take 2 tablets by mouth daily.    . Misc Natural Products (GINSENG COMPLEX PO) Take 1 capsule by mouth daily.     . Multiple Vitamins-Minerals (MULTIVITAMIN GUMMIES MENS PO) Take 1 tablet by mouth daily.    . tamsulosin (FLOMAX) 0.4 MG CAPS capsule Take 0.4 mg by mouth daily.     No current facility-administered medications on file prior to visit.      PAST SURGICAL HISTORY Past Surgical History:  Procedure Laterality Date  . APPENDECTOMY  age 26  . COLONOSCOPY N/A  05/05/2016   Procedure: COLONOSCOPY;  Surgeon: Daneil Dolin, MD;  [facility];  Service: Endoscopy;  Laterality: N/A;  815  . COLONOSCOPY  01/2015   Dr. Carol Ada: hyperplastic polyps. H/O tubulovillous adenoma as well.   . COLONOSCOPY W/ BIOPSIES  2009   non-cancerous  . ESOPHAGOGASTRODUODENOSCOPY N/A 05/05/2016   Procedure: ESOPHAGOGASTRODUODENOSCOPY (EGD);  Surgeon: Daneil Dolin, MD;  [facility];  Service: Endoscopy;  Laterality: N/A;  . GIVENS CAPSULE STUDY N/A 09/06/2016   Procedure: GIVENS CAPSULE STUDY;  Surgeon: Daneil Dolin, MD;  [facility];  Service: Endoscopy;  Laterality: N/A;  7:30am (patient will arrive at 7:00am)  . PROSTATE BIOPSY  11/05/13   adenocarcinoma, gleason 6  . RADIOACTIVE SEED IMPLANT N/A 01/29/2014   Procedure: RADIOACTIVE SEED IMPLANT;  Surgeon: Arvil Persons, MD;  Location: [facility];  Service: Urology;  Laterality: N/A;     seeds implanted no seeds found in bladder    FAMILY HISTORY: Family History  Problem Relation Age of Onset  . Diabetes Mother   . Stroke Father   . Cancer Cousin     prostate  . Colon cancer Neg Hx     SOCIAL HISTORY:  reports that he quit smoking about 42 years ago. His smoking use included Cigarettes, Pipe, and Cigars. He has a 25.00 pack-year smoking history. He has never used smokeless tobacco. He reports that he drinks alcohol. He reports that he does not use drugs.  Social History   Social History  . Marital status: Married    Spouse name: N/A  . Number of children: N/A  . Years of education: N/A   Social History Main Topics  . Smoking status: Former Smoker    Packs/day: 1.00    Years: 25.00    Types: Cigarettes, Pipe, Cigars    Quit date: 12/04/1973  . Smokeless tobacco: Never Used  . Alcohol use Yes     Comment: occasionally brandy  . Drug use: No  . Sexual activity: Not on file   Other Topics Concern  . Not on file   Social History Narrative  . No  narrative on file    PERFORMANCE STATUS: The patient's performance status is 0 - Asymptomatic  PHYSICAL EXAM: Most Recent Vital Signs: Blood pressure (!) 142/72, pulse 84, temperature 98.1 F (36.7 C), temperature source Oral, resp. rate 18, weight 221 lb 12.8 oz (100.6 kg), SpO2 97 %. BP (!) 142/72 (BP Location: Right Arm, Patient Position: Sitting)   Pulse 84   Temp 98.1 F (36.7 C) (Oral)   Resp 18   Wt 221 lb 12.8 oz (100.6 kg)   SpO2 97%   BMI 32.75 kg/m   General Appearance:    Alert, cooperative, no distress, appears younger than stated age  Head:    Normocephalic, without obvious abnormality, atraumatic  Eyes:    Conjunctiva/corneas clear, EOM's intact, both eyes       Ears:    Normal TM's and external ear canals, both ears  Nose:   Nares normal, septum midline, mucosa normal, no drainage    or sinus tenderness  Throat:   Lips, mucosa, and tongue normal; teeth and gums normal  Neck:   Supple, symmetrical, trachea midline, no adenopathy;       thyroid:  No enlargement/tenderness/nodules.  Back:     Symmetric, no curvature, ROM normal, no CVA tenderness  Lungs:     Clear to auscultation bilaterally, respirations unlabored  Chest wall:    No tenderness or deformity  Heart:    Regular rate and rhythm, S1 and S2 normal, no murmur, rub   or gallop  Abdomen:     Soft, non-tender, bowel sounds active all four quadrants,    no masses, no organomegaly  Genitalia:    Not examined  Rectal:    Not examined  Extremities:   Extremities normal, atraumatic, no cyanosis or edema  Pulses:   2+ and symmetric all extremities  Skin:   Skin color, texture, turgor normal, no rashes or lesions  Lymph nodes:   Cervical, supraclavicular, and axillary nodes normal  Neurologic:   CNII-XII intact. Normal strength, sensation and reflexes      throughout    LABORATORY DATA:   CBC    Component Value Date/Time   WBC 3.5 (L) 08/09/2016 0907   RBC 4.96 08/09/2016 0907   HGB 12.4 (L) 08/09/2016  0907   HCT 39.4 08/09/2016 0907   PLT 309 08/09/2016 0907   MCV 79.4 (L) 08/09/2016 0907   MCH 25.0 (L) 08/09/2016 0907   MCHC 31.5 (L) 08/09/2016 0907   RDW 19.9 (H) 08/09/2016 0907   LYMPHSABS 1,435 08/09/2016 0907   MONOABS 350 08/09/2016 0907   EOSABS 140 08/09/2016 0907   BASOSABS 35 08/09/2016 0907   Lab Results  Component Value Date   IRON 39 (L) 08/09/2016   TIBC 364 08/09/2016   FERRITIN 13 (L) 08/09/2016    RADIOGRAPHY: No results found.     PATHOLOGY:    Diagnosis 1. Colon, polyp(s), splenic flexure - TUBULAR ADENOMA. NO HIGH GRADE DYSPLASIA OR MALIGNANCY IDENTIFIED. 2. Stomach, biopsy, gastric erosions - ANTRAL MUCOSA WITH SLIGHT CHRONIC INFLAMMATION, HYPEREMIA AND FOCAL EROSION. - WARTHIN-STARRY STAIN NEGATIVE FOR HELICOBACTER PYLORI. - NO INTESTINAL METAPLASIA, DYSPLASIA OR MALIGNANCY. Claudette Laws MD Pathologist, Electronic Signature (Case signed 05/06/2016)    ASSESSMENT/PLAN:   IDA (iron deficiency anemia) Iron deficiency anemia with low ferritin and a microcytic, hypochromic anemia.  EGD/Colonoscopy by Dr. Gala Romney on 05/05/2016 demonstrating gastric antrum erosions, diverticulosis in sigmoid and descending colon with a single 5 mm polyp at splenic flexure that was removed.  Pathology is negative for malignancy/dysplasia.  S/P Camera Capsule study on 09/06/2016 negative for any source of anemia.  Previous labs are reviewed with the patient.  I personally reviewed and went over laboratory results with the patient.  The results are noted within this dictation.  Based upon these labs, his calculated iron deficit is 350 mg.  Labs today: CBC diff, CMET, LDH, anemia panel, haptoglobin, EPO level, retic count, pathology smear review.  With recent GI work-up, will forego stool card testing at this time.  Given his age and being a black American with anemia, will screen for multiple myeloma: SPEP + IFE, light chain assay, and IgG, IgA, IgM.  I personally reviewed  and went over radiographic studies  with the patient.  The results are noted within this dictation.  I personally reviewed the images in PACS.  I personally reviewed and went over pathology results with the patient.  Pathology from EGD/Colonoscopy is negative for malignancy/dysplasia.  Iron deficiency anemia is the most common anemia.  Beside playing a critical role as an oxygen carrier in the heme group of hemoglobin, iron is found in many key proteins in the cells, such as cytochromes and myoglobin, so it is not unexpected that a lack of iron has effects other than anemia.  Three studies have focused on nonanemic iron deficiency leading to fatigue.  Two studies showed that oral iron supplementation reduces fatigue, with no significant change in hemoglobin levels, in women with a ferritin level of less than 50 ng/mL, and a third study showed a lessening of fatigue with parental iron administration in women with a ferritin level of 15 ng/mL or less or an iron saturation of 20% or less.   Owing to obligate iron loss through menses, women are at greater risk for iron deficiency than men.  Iron loss in all women averages 1-3 ng per day, and dietary intake is often inadequate to maintain a positive iron balance.  A 1967 study showed that 25% of healthy, college-age women had no bone marrow iron stores and that another 33% had low stores.  Pregnancy adds to demands for iron, with requirements increasing to 6 ng per day by the end of pregnancy.  Athletes are another group at risk for iron deficiency.  Gastrointestinal tract blood is the source of iron loss, and exercise-induced hemolysis leads to urinary iron losses.  Decreased absorption of iron has also been implicated as a cause of iron deficiency, because of levels of hepcidin are often elevated in athletes owing to training-induced inflammation.    Obesity and its surgical treatment are also at risk factors for iron deficiency.  Obese patients are often  iron-deficient, with increased hepcidin level being implicated in decreased absorption.  After bariatric surgery, the incidence of iron deficiency can be as high as 50%.  Because the main site of iron absorption is the duodenum, surgeries that involve bypassing this part of the bowel are associated with an increased incidence of iron deficiency.  However, iron deficiency is seen as a sequela of most types of bariatric surgery.    -NEJM Volume 371, No 14, pg 1325-1326  Based upon results of aforementioned labs, will determine the need for IV iron replacement therapy.  His anemia in the past was very mild/minimal.  No role for PRBC transfusion.  A trial of oral iron would not be unreasonable as well.   Labs in 6 weeks: CBC diff, BMET, iron/TIBC, ferritin.  Return in 6 weeks for follow-up.  Malignant neoplasm of prostate [facility]) Prostate cancer, treated with brachytherapy.  Followed by Dr. Alyson Ingles.   ORDERS PLACED FOR THIS ENCOUNTER: Orders Placed This Encounter  Procedures  . CBC with Differential  . Comprehensive metabolic panel  . Lactate dehydrogenase  . Pathologist smear review  . Vitamin B12  . Folate  . Iron and TIBC  . Ferritin  . Erythropoietin  . Haptoglobin  . Lactate dehydrogenase  . Reticulocytes  . Protein electrophoresis, serum  . Immunofixation electrophoresis  . IgG, IgA, IgM  . Kappa/lambda light chains  . CBC with Differential  . Iron and TIBC  . Ferritin  . Basic metabolic panel    MEDICATIONS PRESCRIBED THIS ENCOUNTER: No orders of the defined types were placed in  this encounter.   All questions were answered. The patient knows to call the clinic with any problems, questions or concerns. We can certainly see the patient much sooner if necessary.  Patient discussed with Dr. Talbert Cage and together we ascertained an up-to-date interval history, and examined the patient.  Dr. Talbert Cage developed the patient's assessment and plan.  This was a shared visit-consultation.   Her attestation will follow below.  This note is electronically signed by: Doy Mince 09/29/2016 3:50 PM

## 2016-09-29 NOTE — Assessment & Plan Note (Addendum)
Iron deficiency anemia with low ferritin and a microcytic, hypochromic anemia.  EGD/Colonoscopy by Dr. Gala Romney on 05/05/2016 demonstrating gastric antrum erosions, diverticulosis in sigmoid and descending colon with a single 5 mm polyp at splenic flexure that was removed.  Pathology is negative for malignancy/dysplasia.  S/P Camera Capsule study on 09/06/2016 negative for any source of anemia.  Previous labs are reviewed with the patient.  I personally reviewed and went over laboratory results with the patient.  The results are noted within this dictation.  Based upon these labs, his calculated iron deficit is 350 mg.  Labs today: CBC diff, CMET, LDH, anemia panel, haptoglobin, EPO level, retic count, pathology smear review.  With recent GI work-up, will forego stool card testing at this time.  Given his age and being a black American with anemia, will screen for multiple myeloma: SPEP + IFE, light chain assay, and IgG, IgA, IgM.  I personally reviewed and went over radiographic studies with the patient.  The results are noted within this dictation.  I personally reviewed the images in PACS.  I personally reviewed and went over pathology results with the patient.  Pathology from EGD/Colonoscopy is negative for malignancy/dysplasia.  Iron deficiency anemia is the most common anemia.  Beside playing a critical role as an oxygen carrier in the heme group of hemoglobin, iron is found in many key proteins in the cells, such as cytochromes and myoglobin, so it is not unexpected that a lack of iron has effects other than anemia.  Three studies have focused on nonanemic iron deficiency leading to fatigue.  Two studies showed that oral iron supplementation reduces fatigue, with no significant change in hemoglobin levels, in women with a ferritin level of less than 50 ng/mL, and a third study showed a lessening of fatigue with parental iron administration in women with a ferritin level of 15 ng/mL or less or an iron  saturation of 20% or less.   Owing to obligate iron loss through menses, women are at greater risk for iron deficiency than men.  Iron loss in all women averages 1-3 ng per day, and dietary intake is often inadequate to maintain a positive iron balance.  A 1967 study showed that 25% of healthy, college-age women had no bone marrow iron stores and that another 33% had low stores.  Pregnancy adds to demands for iron, with requirements increasing to 6 ng per day by the end of pregnancy.  Athletes are another group at risk for iron deficiency.  Gastrointestinal tract blood is the source of iron loss, and exercise-induced hemolysis leads to urinary iron losses.  Decreased absorption of iron has also been implicated as a cause of iron deficiency, because of levels of hepcidin are often elevated in athletes owing to training-induced inflammation.    Obesity and its surgical treatment are also at risk factors for iron deficiency.  Obese patients are often iron-deficient, with increased hepcidin level being implicated in decreased absorption.  After bariatric surgery, the incidence of iron deficiency can be as high as 50%.  Because the main site of iron absorption is the duodenum, surgeries that involve bypassing this part of the bowel are associated with an increased incidence of iron deficiency.  However, iron deficiency is seen as a sequela of most types of bariatric surgery.    -NEJM Volume 371, No 14, pg 1325-1326  Based upon results of aforementioned labs, will determine the need for IV iron replacement therapy.  His anemia in the past was very mild/minimal.  No role for PRBC transfusion.  A trial of oral iron would not be unreasonable as well.   Labs in 6 weeks: CBC diff, BMET, iron/TIBC, ferritin.  Return in 6 weeks for follow-up.

## 2016-09-29 NOTE — Assessment & Plan Note (Signed)
Prostate cancer, treated with brachytherapy.  Followed by Dr. Alyson Ingles.

## 2016-09-30 ENCOUNTER — Other Ambulatory Visit (HOSPITAL_COMMUNITY): Payer: Self-pay | Admitting: Oncology

## 2016-09-30 LAB — PROTEIN ELECTROPHORESIS, SERUM
A/G Ratio: 1.2 (ref 0.7–1.7)
ALPHA-1-GLOBULIN: 0.2 g/dL (ref 0.0–0.4)
ALPHA-2-GLOBULIN: 0.7 g/dL (ref 0.4–1.0)
Albumin ELP: 3.9 g/dL (ref 2.9–4.4)
Beta Globulin: 1.1 g/dL (ref 0.7–1.3)
Gamma Globulin: 1.2 g/dL (ref 0.4–1.8)
Globulin, Total: 3.2 g/dL (ref 2.2–3.9)
Total Protein ELP: 7.1 g/dL (ref 6.0–8.5)

## 2016-09-30 LAB — PATHOLOGIST SMEAR REVIEW

## 2016-09-30 LAB — IGG, IGA, IGM
IGA: 126 mg/dL (ref 61–437)
IGG (IMMUNOGLOBIN G), SERUM: 1047 mg/dL (ref 700–1600)
IGM, SERUM: 122 mg/dL (ref 15–143)

## 2016-09-30 LAB — KAPPA/LAMBDA LIGHT CHAINS
KAPPA, LAMDA LIGHT CHAIN RATIO: 1.72 — AB (ref 0.26–1.65)
Kappa free light chain: 21.1 mg/L — ABNORMAL HIGH (ref 3.3–19.4)
LAMDA FREE LIGHT CHAINS: 12.3 mg/L (ref 5.7–26.3)

## 2016-09-30 LAB — HAPTOGLOBIN: Haptoglobin: 184 mg/dL (ref 34–200)

## 2016-09-30 LAB — ERYTHROPOIETIN: Erythropoietin: 42.4 m[IU]/mL — ABNORMAL HIGH (ref 2.6–18.5)

## 2016-10-04 ENCOUNTER — Encounter (HOSPITAL_BASED_OUTPATIENT_CLINIC_OR_DEPARTMENT_OTHER): Payer: Medicare Other

## 2016-10-04 VITALS — BP 119/66 | HR 76 | Temp 97.7°F | Resp 16 | Wt 219.2 lb

## 2016-10-04 DIAGNOSIS — D509 Iron deficiency anemia, unspecified: Secondary | ICD-10-CM | POA: Diagnosis present

## 2016-10-04 DIAGNOSIS — D500 Iron deficiency anemia secondary to blood loss (chronic): Secondary | ICD-10-CM

## 2016-10-04 LAB — IMMUNOFIXATION ELECTROPHORESIS
IGA: 129 mg/dL (ref 61–437)
IGM, SERUM: 128 mg/dL (ref 15–143)
IgG (Immunoglobin G), Serum: 1046 mg/dL (ref 700–1600)
Total Protein ELP: 6.9 g/dL (ref 6.0–8.5)

## 2016-10-04 MED ORDER — SODIUM CHLORIDE 0.9 % IV SOLN
510.0000 mg | Freq: Once | INTRAVENOUS | Status: AC
  Administered 2016-10-04: 510 mg via INTRAVENOUS
  Filled 2016-10-04: qty 17

## 2016-10-04 MED ORDER — SODIUM CHLORIDE 0.9 % IV SOLN
Freq: Once | INTRAVENOUS | Status: AC
  Administered 2016-10-04: 11:00:00 via INTRAVENOUS

## 2016-10-04 NOTE — Progress Notes (Signed)
Tolerated iron infusion well. Stable and ambulatory on discharge home with wife. 

## 2016-10-04 NOTE — Patient Instructions (Signed)
[facility] Discharge Instructions  RECOMMENDATIONS MADE BY THE CONSULTANT AND ANY TEST RESULTS WILL BE SENT TO YOUR REFERRING PHYSICIAN.  Feraheme 510 mg iron infusion given as ordered.  Thank you for choosing [facility] to provide your oncology and hematology care.  To afford each patient quality time with our provider, please arrive at least 15 minutes before your scheduled appointment time.    If you have a lab appointment with the Cancer Center please come in thru the  Main Entrance and check in at the main information desk  You need to re-schedule your appointment should you arrive 10 or more minutes late.  We strive to give you quality time with our providers, and arriving late affects you and other patients whose appointments are after yours.  Also, if you no show three or more times for appointments you may be dismissed from the clinic at the providers discretion.     Again, thank you for choosing [facility].  Our hope is that these requests will decrease the amount of time that you wait before being seen by our physicians.       _____________________________________________________________  Should you have questions after your visit to [facility], please contact our office at [phone] between the hours of 8:30 a.m. and 4:30 p.m.  Voicemails left after 4:30 p.m. will not be returned until the following business day.  For prescription refill requests, have your pharmacy contact our office.       Resources For Cancer Patients and their Caregivers ? American Cancer Society: Can assist with transportation, wigs, general needs, runs Look Good Feel Better.        1-888-227-6333 ? Cancer Care: Provides financial assistance, online support groups, medication/co-pay assistance.  1-800-813-HOPE (4673) ? [facility] Assists [place] cancer patients and  their families through emotional , educational and financial support.  336-427-4357 ? [place] DSS Where to apply for food stamps, Medicaid and utility assistance. 336-342-1394 ? [facility]: Transportation to medical appointments. 336-347-2287 ? Social Security Administration: May apply for disability if have a Stage IV cancer. 336-342-7796 1-800-772-1213 ? [place] Aging, Disability and Transit Services: Assists with nutrition, care and transit needs. 336-349-2343  Cancer Center Support Programs: @10RELATIVEDAYS@ > Cancer Support Group  2nd Tuesday of the month 1pm-2pm, Journey Room  > Creative Journey  3rd Tuesday of the month 1130am-1pm, Journey Room  > Look Good Feel Better  1st Wednesday of the month 10am-12 noon, Journey Room (Call American Cancer Society to register 1-800-395-5775)   

## 2016-10-27 DIAGNOSIS — E876 Hypokalemia: Secondary | ICD-10-CM | POA: Diagnosis not present

## 2016-10-27 DIAGNOSIS — I10 Essential (primary) hypertension: Secondary | ICD-10-CM | POA: Diagnosis not present

## 2016-10-27 DIAGNOSIS — D508 Other iron deficiency anemias: Secondary | ICD-10-CM | POA: Diagnosis not present

## 2016-10-27 DIAGNOSIS — C61 Malignant neoplasm of prostate: Secondary | ICD-10-CM | POA: Diagnosis not present

## 2016-10-27 DIAGNOSIS — E874 Mixed disorder of acid-base balance: Secondary | ICD-10-CM | POA: Diagnosis not present

## 2016-10-27 DIAGNOSIS — E1169 Type 2 diabetes mellitus with other specified complication: Secondary | ICD-10-CM | POA: Diagnosis not present

## 2016-11-04 ENCOUNTER — Other Ambulatory Visit: Payer: Self-pay

## 2016-11-04 DIAGNOSIS — D509 Iron deficiency anemia, unspecified: Secondary | ICD-10-CM

## 2016-11-10 ENCOUNTER — Encounter (HOSPITAL_BASED_OUTPATIENT_CLINIC_OR_DEPARTMENT_OTHER): Payer: Medicare Other | Admitting: Oncology

## 2016-11-10 ENCOUNTER — Encounter (HOSPITAL_COMMUNITY): Payer: Medicare Other | Attending: Oncology

## 2016-11-10 ENCOUNTER — Encounter (HOSPITAL_COMMUNITY): Payer: Self-pay | Admitting: Oncology

## 2016-11-10 VITALS — BP 131/61 | HR 79 | Temp 97.8°F | Resp 18 | Wt 217.8 lb

## 2016-11-10 DIAGNOSIS — Z87891 Personal history of nicotine dependence: Secondary | ICD-10-CM

## 2016-11-10 DIAGNOSIS — Z8042 Family history of malignant neoplasm of prostate: Secondary | ICD-10-CM

## 2016-11-10 DIAGNOSIS — C61 Malignant neoplasm of prostate: Secondary | ICD-10-CM | POA: Diagnosis not present

## 2016-11-10 DIAGNOSIS — D509 Iron deficiency anemia, unspecified: Secondary | ICD-10-CM

## 2016-11-10 DIAGNOSIS — D500 Iron deficiency anemia secondary to blood loss (chronic): Secondary | ICD-10-CM | POA: Diagnosis not present

## 2016-11-10 DIAGNOSIS — I10 Essential (primary) hypertension: Secondary | ICD-10-CM

## 2016-11-10 LAB — IRON AND TIBC
Iron: 52 ug/dL (ref 45–182)
Saturation Ratios: 15 % — ABNORMAL LOW (ref 17.9–39.5)
TIBC: 337 ug/dL (ref 250–450)
UIBC: 285 ug/dL

## 2016-11-10 LAB — CBC WITH DIFFERENTIAL/PLATELET
Basophils Absolute: 0 10*3/uL (ref 0.0–0.1)
Basophils Relative: 1 %
EOS PCT: 5 %
Eosinophils Absolute: 0.2 10*3/uL (ref 0.0–0.7)
HCT: 40.6 % (ref 39.0–52.0)
Hemoglobin: 13.3 g/dL (ref 13.0–17.0)
LYMPHS ABS: 1.4 10*3/uL (ref 0.7–4.0)
LYMPHS PCT: 42 %
MCH: 27.1 pg (ref 26.0–34.0)
MCHC: 32.8 g/dL (ref 30.0–36.0)
MCV: 82.9 fL (ref 78.0–100.0)
MONO ABS: 0.3 10*3/uL (ref 0.1–1.0)
Monocytes Relative: 8 %
Neutro Abs: 1.5 10*3/uL — ABNORMAL LOW (ref 1.7–7.7)
Neutrophils Relative %: 44 %
PLATELETS: 213 10*3/uL (ref 150–400)
RBC: 4.9 MIL/uL (ref 4.22–5.81)
RDW: 19.8 % — AB (ref 11.5–15.5)
WBC: 3.4 10*3/uL — ABNORMAL LOW (ref 4.0–10.5)

## 2016-11-10 LAB — BASIC METABOLIC PANEL WITH GFR
Anion gap: 10 (ref 5–15)
BUN: 16 mg/dL (ref 6–20)
CO2: 27 mmol/L (ref 22–32)
Calcium: 9.6 mg/dL (ref 8.9–10.3)
Chloride: 101 mmol/L (ref 101–111)
Creatinine, Ser: 1.06 mg/dL (ref 0.61–1.24)
GFR calc Af Amer: >60 mL/min
GFR calc non Af Amer: >60 mL/min
Glucose, Bld: 101 mg/dL — ABNORMAL HIGH (ref 65–99)
Potassium: 3.2 mmol/L — ABNORMAL LOW (ref 3.5–5.1)
Sodium: 138 mmol/L (ref 135–145)

## 2016-11-10 LAB — FERRITIN: Ferritin: 66 ng/mL (ref 24–336)

## 2016-11-10 NOTE — Progress Notes (Signed)
[facility] Hematology/Oncology Consultation   Name: Bruce Duarte      MRN: [id number]    Location: Room/bed info not found  Date: 11/10/2016 Time:2:21 PM   REFERRING PHYSICIAN:  Roseanne Kaufman, NP (GI)  REASON FOR CONSULT:  Iron deficiency anemia   DIAGNOSIS:  Iron deficiency anemia with ferritin of 13, microcytic, hypochromic.  HISTORY OF PRESENT ILLNESS:   Bruce Duarte is a 81 y.o. male with a medical history significant for history of prostate cancer, dyslipidemia, hypertension who is referred to the [facility] for iron deficiency anemia.  He has undergone a complete GI work-up.  EGD/Colonoscopy by Dr. Gala Romney on 05/05/2016 demonstrating gastric antrum erosions, diverticulosis in sigmoid and descending colon with a single 5 mm polyp at splenic flexure that was removed.  Pathology is negative for malignancy/dysplasia.  S/P Camera Capsule study on 09/06/2016 negative for any source of anemia.  He reports being notified of his anemia by his primary care physician.  He was subsequently referred to GI for workup of his anemia.  As mentioned above, GI workup is complete.  He is referred to [place] from GI for his iron deficiency anemia.  He reports one episode of black stools in the distant past.  He notes that this was approximately around the time in which she was diagnosed by his primary care doctor with anemia.  He denies any blood in his stools, black stools hematuria, hemoptysis, or any other blood loss.  . Patient is feeling stronger.  Her iron infusion with Feraheme 3 weeks ago.  HPI Elements Iron deficiency  Location: Blood  Quality: Microcytic, hypochromic  Severity: Minimal  Duration: Since March 2018  Context: In the setting of gastric erosions (erosive gastritis)  Timing:   Modifying Factors:   Associated Signs & Symptoms: Episode of black stools in the past (patient reported).   He started taking an OTC iron pill 2 weeks ago.  He is taking 1  tablet per day.   Review of Systems  Constitutional: Negative.  Negative for chills, fever and weight loss.  HENT: Negative.   Eyes: Negative.   Respiratory: Negative.  Negative for cough.   Cardiovascular: Negative.  Negative for chest pain.  Gastrointestinal: Negative.  Negative for blood in stool, constipation, diarrhea, melena, nausea and vomiting.  Genitourinary: Negative.   Musculoskeletal: Negative.   Skin: Negative.   Neurological: Negative.  Negative for weakness.  Endo/Heme/Allergies: Negative.   Psychiatric/Behavioral: Negative.      PAST MEDICAL HISTORY:   Past Medical History:  Diagnosis Date  . Arthritis   . At risk for sleep apnea    STOP-BANG= 5   SENT TO PCP 01-22-2014  . Borderline type 2 diabetes mellitus   . Hyperlipidemia   . Hypertension   . IDA (iron deficiency anemia) 07/27/2016  . Malignant neoplasm of prostate ([place]) 12/04/2013  . Prostate cancer ([place]) DX  2013   Gleason 3+3=6, vol 42.34 cc  . Wears glasses     ALLERGIES: No Known Allergies    MEDICATIONS: I have reviewed the patient's current medications.    Current Outpatient Prescriptions on File Prior to Visit  Medication Sig Dispense Refill  . aspirin EC 81 MG tablet Take 81 mg by mouth daily.    Marland Kitchen docusate sodium (COLACE) 100 MG capsule Take 200 mg by mouth daily.    Vanessa Kick Ethyl (VASCEPA) 1 g CAPS Take 1 capsule by mouth 2 (two) times daily.    Marland Kitchen  losartan-hydrochlorothiazide (HYZAAR) 100-25 MG per tablet Take 1 tablet by mouth every morning.     . metFORMIN (GLUCOPHAGE) 500 MG tablet Take 2 tablets by mouth daily.    . Misc Natural Products (GINSENG COMPLEX PO) Take 1 capsule by mouth daily.     . Multiple Vitamins-Minerals (MULTIVITAMIN GUMMIES MENS PO) Take 1 tablet by mouth daily.    . tamsulosin (FLOMAX) 0.4 MG CAPS capsule Take 0.4 mg by mouth daily.     No current facility-administered medications on file prior to visit.      PAST SURGICAL HISTORY Past Surgical History:    Procedure Laterality Date  . APPENDECTOMY  age 43  . COLONOSCOPY N/A 05/05/2016   Procedure: COLONOSCOPY;  Surgeon: Daneil Dolin, MD;  [facility];  Service: Endoscopy;  Laterality: N/A;  815  . COLONOSCOPY  01/2015   Dr. Carol Ada: hyperplastic polyps. H/O tubulovillous adenoma as well.   . COLONOSCOPY W/ BIOPSIES  2009   non-cancerous  . ESOPHAGOGASTRODUODENOSCOPY N/A 05/05/2016   Procedure: ESOPHAGOGASTRODUODENOSCOPY (EGD);  Surgeon: Daneil Dolin, MD;  [facility];  Service: Endoscopy;  Laterality: N/A;  . GIVENS CAPSULE STUDY N/A 09/06/2016   Procedure: GIVENS CAPSULE STUDY;  Surgeon: Daneil Dolin, MD;  [facility];  Service: Endoscopy;  Laterality: N/A;  7:30am (patient will arrive at 7:00am)  . PROSTATE BIOPSY  11/05/13   adenocarcinoma, gleason 6  . RADIOACTIVE SEED IMPLANT N/A 01/29/2014   Procedure: RADIOACTIVE SEED IMPLANT;  Surgeon: Arvil Persons, MD;  Location: [facility];  Service: Urology;  Laterality: N/A;     seeds implanted no seeds found in bladder    FAMILY HISTORY: Family History  Problem Relation Age of Onset  . Diabetes Mother   . Stroke Father   . Cancer Cousin        prostate  . Colon cancer Neg Hx     SOCIAL HISTORY:  reports that he quit smoking about 42 years ago. His smoking use included Cigarettes, Pipe, and Cigars. He has a 25.00 pack-year smoking history. He has never used smokeless tobacco. He reports that he drinks alcohol. He reports that he does not use drugs.  Social History   Social History  . Marital status: Married    Spouse name: N/A  . Number of children: N/A  . Years of education: N/A   Social History Main Topics  . Smoking status: Former Smoker    Packs/day: 1.00    Years: 25.00    Types: Cigarettes, Pipe, Cigars    Quit date: 12/04/1973  . Smokeless tobacco: Never Used  . Alcohol use Yes     Comment: occasionally brandy  . Drug use: No  . Sexual activity: Not Asked    Other Topics Concern  . None   Social History Narrative  . None    PERFORMANCE STATUS: The patient's performance status is 0 - Asymptomatic  PHYSICAL EXAM: Most Recent Vital Signs: Blood pressure 131/61, pulse 79, temperature 97.8 F (36.6 C), temperature source Oral, resp. rate 18, weight 217 lb 12.8 oz (98.8 kg), SpO2 98 %. BP 131/61 (BP Location: Right Arm, Patient Position: Sitting)   Pulse 79   Temp 97.8 F (36.6 C) (Oral)   Resp 18   Wt 217 lb 12.8 oz (98.8 kg)   SpO2 98%   BMI 32.16 kg/m   General Appearance:    Alert, cooperative, no distress, appears younger than stated age  Head:  Normocephalic, without obvious abnormality, atraumatic  Eyes:    Conjunctiva/corneas clear, EOM's intact, both eyes       Ears:    Normal TM's and external ear canals, both ears  Nose:   Nares normal, septum midline, mucosa normal, no drainage    or sinus tenderness  Throat:   Lips, mucosa, and tongue normal; teeth and gums normal  Neck:   Supple, symmetrical, trachea midline, no adenopathy;       thyroid:  No enlargement/tenderness/nodules.  Back:     Symmetric, no curvature, ROM normal, no CVA tenderness  Lungs:     Clear to auscultation bilaterally, respirations unlabored  Chest wall:    No tenderness or deformity  Heart:    Regular rate and rhythm, S1 and S2 normal, no murmur, rub   or gallop  Abdomen:     Soft, non-tender, bowel sounds active all four quadrants,    no masses, no organomegaly  Genitalia:    Not examined  Rectal:    Not examined  Extremities:   Extremities normal, atraumatic, no cyanosis or edema  Pulses:   2+ and symmetric all extremities  Skin:   Skin color, texture, turgor normal, no rashes or lesions  Lymph nodes:   Cervical, supraclavicular, and axillary nodes normal  Neurologic:   CNII-XII intact. Normal strength, sensation and reflexes      throughout    LABORATORY DATA:   CBC    Component Value Date/Time   WBC 3.4 (L) 11/10/2016 1314   RBC 4.90  11/10/2016 1314   HGB 13.3 11/10/2016 1314   HCT 40.6 11/10/2016 1314   PLT 213 11/10/2016 1314   MCV 82.9 11/10/2016 1314   MCH 27.1 11/10/2016 1314   MCHC 32.8 11/10/2016 1314   RDW 19.8 (H) 11/10/2016 1314   LYMPHSABS 1.4 11/10/2016 1314   MONOABS 0.3 11/10/2016 1314   EOSABS 0.2 11/10/2016 1314   BASOSABS 0.0 11/10/2016 1314   Lab Results  Component Value Date   IRON 36 (L) 09/29/2016   TIBC 416 09/29/2016   FERRITIN 10 (L) 09/29/2016    RADIOGRAPHY: No results found.     PATHOLOGY:    Diagnosis 1. Colon, polyp(s), splenic flexure - TUBULAR ADENOMA. NO HIGH GRADE DYSPLASIA OR MALIGNANCY IDENTIFIED. 2. Stomach, biopsy, gastric erosions - ANTRAL MUCOSA WITH SLIGHT CHRONIC INFLAMMATION, HYPEREMIA AND FOCAL EROSION. - WARTHIN-STARRY STAIN NEGATIVE FOR HELICOBACTER PYLORI. - NO INTESTINAL METAPLASIA, DYSPLASIA OR MALIGNANCY. Claudette Laws MD Pathologist, Electronic Signature (Case signed 05/06/2016)    ASSESSMENT/PLAN:  Iron deficiency anemia  Vitamin B12 level in May of 2018 was 396  Last ferritin in May was 10  Ferritin on today's examination not available.  If ferritin is low patient will continue to get maintenance iron therapy.  Return appointment in 3 months  ORDERS PLACED FOR THIS ENCOUNTER: No orders of the defined types were placed in this encounter.   MEDICATIONS PRESCRIBED THIS ENCOUNTER: No orders of the defined types were placed in this encounter.    This note is electronically signed by: Forest Gleason, MD 11/10/2016 2:21 PM

## 2016-11-10 NOTE — Patient Instructions (Signed)
[facility] at [facility] Discharge Instructions  RECOMMENDATIONS MADE BY THE CONSULTANT AND ANY TEST RESULTS WILL BE SENT TO YOUR REFERRING PHYSICIAN.  You were seen today by Dr. Choksi.   Thank you for choosing [facility] at [facility] to provide your oncology and hematology care.  To afford each patient quality time with our provider, please arrive at least 15 minutes before your scheduled appointment time.    If you have a lab appointment with the Cancer Center please come in thru the  Main Entrance and check in at the main information desk  You need to re-schedule your appointment should you arrive 10 or more minutes late.  We strive to give you quality time with our providers, and arriving late affects you and other patients whose appointments are after yours.  Also, if you no show three or more times for appointments you may be dismissed from the clinic at the providers discretion.     Again, thank you for choosing [facility].  Our hope is that these requests will decrease the amount of time that you wait before being seen by our physicians.       _____________________________________________________________  Should you have questions after your visit to [facility], please contact our office at [phone] between the hours of 8:30 a.m. and 4:30 p.m.  Voicemails left after 4:30 p.m. will not be returned until the following business day.  For prescription refill requests, have your pharmacy contact our office.       Resources For Cancer Patients and their Caregivers ? American Cancer Society: Can assist with transportation, wigs, general needs, runs Look Good Feel Better.        1-888-227-6333 ? Cancer Care: Provides financial assistance, online support groups, medication/co-pay assistance.  1-800-813-HOPE (4673) ? [facility] Assists [place] cancer patients and their  families through emotional , educational and financial support.  336-427-4357 ? [place] DSS Where to apply for food stamps, Medicaid and utility assistance. 336-342-1394 ? [facility]: Transportation to medical appointments. 336-347-2287 ? Social Security Administration: May apply for disability if have a Stage IV cancer. 336-342-7796 1-800-772-1213 ? [place] Aging, Disability and Transit Services: Assists with nutrition, care and transit needs. 336-349-2343  Cancer Center Support Programs: @10RELATIVEDAYS@ > Cancer Support Group  2nd Tuesday of the month 1pm-2pm, Journey Room  > Creative Journey  3rd Tuesday of the month 1130am-1pm, Journey Room  > Look Good Feel Better  1st Wednesday of the month 10am-12 noon, Journey Room (Call American Cancer Society to register 1-800-395-5775)    

## 2016-11-13 ENCOUNTER — Other Ambulatory Visit (HOSPITAL_COMMUNITY): Payer: Self-pay | Admitting: Oncology

## 2016-11-13 DIAGNOSIS — D500 Iron deficiency anemia secondary to blood loss (chronic): Secondary | ICD-10-CM

## 2016-11-13 DIAGNOSIS — E876 Hypokalemia: Secondary | ICD-10-CM

## 2016-11-13 MED ORDER — POTASSIUM CHLORIDE CRYS ER 20 MEQ PO TBCR: 20.0000 meq | EXTENDED_RELEASE_TABLET | Freq: Two times a day (BID) | ORAL | 0 refills | Status: DC

## 2016-11-22 ENCOUNTER — Ambulatory Visit (HOSPITAL_COMMUNITY): Payer: Medicare Other

## 2016-11-26 ENCOUNTER — Encounter (HOSPITAL_BASED_OUTPATIENT_CLINIC_OR_DEPARTMENT_OTHER): Payer: Medicare Other

## 2016-11-26 VITALS — BP 120/54 | HR 73 | Temp 97.5°F | Resp 20

## 2016-11-26 DIAGNOSIS — D509 Iron deficiency anemia, unspecified: Secondary | ICD-10-CM | POA: Diagnosis present

## 2016-11-26 DIAGNOSIS — D500 Iron deficiency anemia secondary to blood loss (chronic): Secondary | ICD-10-CM

## 2016-11-26 MED ORDER — SODIUM CHLORIDE 0.9% FLUSH: 10.0000 mL | INTRAVENOUS | Status: DC | PRN

## 2016-11-26 MED ORDER — ALTEPLASE 2 MG IJ SOLR: 2.0000 mg | Freq: Once | INTRAMUSCULAR | Status: DC | PRN

## 2016-11-26 MED ORDER — HEPARIN SOD (PORK) LOCK FLUSH 100 UNIT/ML IV SOLN: 250.0000 [IU] | Freq: Once | INTRAVENOUS | Status: DC | PRN

## 2016-11-26 MED ORDER — SODIUM CHLORIDE 0.9 % IV SOLN
Freq: Once | INTRAVENOUS | Status: AC
  Administered 2016-11-26: 14:00:00 via INTRAVENOUS

## 2016-11-26 MED ORDER — FERUMOXYTOL INJECTION 510 MG/17 ML
510.0000 mg | Freq: Once | INTRAVENOUS | Status: AC
  Administered 2016-11-26: 510 mg via INTRAVENOUS
  Filled 2016-11-26: qty 17

## 2016-11-26 MED ORDER — SODIUM CHLORIDE 0.9% FLUSH
3.0000 mL | Freq: Once | INTRAVENOUS | Status: DC | PRN
Start: 2016-11-26 — End: 2016-11-26

## 2016-11-26 MED ORDER — HEPARIN SOD (PORK) LOCK FLUSH 100 UNIT/ML IV SOLN: 500.0000 [IU] | Freq: Once | INTRAVENOUS | Status: DC | PRN

## 2016-11-26 NOTE — Progress Notes (Signed)
Pt tolerated infusion without any problems.  Pt discharged ambulatory and in stable condition @ 1459.

## 2016-11-26 NOTE — Patient Instructions (Signed)
[place] at [facility] Discharge Instructions  RECOMMENDATIONS MADE BY THE CONSULTANT AND ANY TEST RESULTS WILL BE SENT TO YOUR REFERRING PHYSICIAN.  Today you received Ferahame 510mg .   Return as scheduled.  Thank you for choosing [place] at [facility] to provide your oncology and hematology care.  To afford each patient quality time with our provider, please arrive at least 15 minutes before your scheduled appointment time.    If you have a lab appointment with the [place] please come in thru the  Main Entrance and check in at the main information desk  You need to re-schedule your appointment should you arrive 10 or more minutes late.  We strive to give you quality time with our providers, and arriving late affects you and other patients whose appointments are after yours.  Also, if you no show three or more times for appointments you may be dismissed from the clinic at the providers discretion.     Again, thank you for choosing [facility].  Our hope is that these requests will decrease the amount of time that you wait before being seen by our physicians.       _____________________________________________________________  Should you have questions after your visit to [facility], please contact our office at [phone] between the hours of 8:30 a.m. and 4:30 p.m.  Voicemails left after 4:30 p.m. will not be returned until the following business day.  For prescription refill requests, have your pharmacy contact our office.       Resources For Cancer Patients and their Caregivers ? American Cancer Society: Can assist with transportation, wigs, general needs, runs Look Good Feel Better.        519-579-6101 ? Cancer Care: Provides financial assistance, online support groups, medication/co-pay assistance.  1-800-813-HOPE 218-458-2338) ? [facility] Assists [place] cancer  patients and their families through emotional , educational and financial support.  7018629238 ? [place] DSS Where to apply for food stamps, Medicaid and utility assistance. 562-443-2949 ? RCATS: Transportation to medical appointments. (870) 783-6092 ? Social Security Administration: May apply for disability if have a Stage IV cancer. 9373824763 812-230-4522 ? [employer], Disability and Transit Services: Assists with nutrition, care and transit needs. [place] Support Programs: @10RELATIVEDAYS @ > Cancer Support Group  2nd Tuesday of the month 1pm-2pm, Journey Room  > Creative Journey  3rd Tuesday of the month 1130am-1pm, Journey Room  > Look Good Feel Better  1st Wednesday of the month 10am-12 noon, Journey Room (Call [employer] to register [phone])

## 2016-12-13 DIAGNOSIS — D509 Iron deficiency anemia, unspecified: Secondary | ICD-10-CM | POA: Diagnosis not present

## 2016-12-13 LAB — CBC WITH DIFFERENTIAL/PLATELET
BASOS ABS: 64 {cells}/uL (ref 0–200)
Basophils Relative: 2 %
EOS ABS: 160 {cells}/uL (ref 15–500)
Eosinophils Relative: 5 %
HEMATOCRIT: 40.8 % (ref 38.5–50.0)
Hemoglobin: 13.4 g/dL (ref 13.2–17.1)
LYMPHS PCT: 43 %
Lymphs Abs: 1376 cells/uL (ref 850–3900)
MCH: 27.2 pg (ref 27.0–33.0)
MCHC: 32.8 g/dL (ref 32.0–36.0)
MCV: 82.9 fL (ref 80.0–100.0)
MONO ABS: 256 {cells}/uL (ref 200–950)
MONOS PCT: 8 %
MPV: 9.8 fL (ref 7.5–12.5)
NEUTROS ABS: 1344 {cells}/uL — AB (ref 1500–7800)
Neutrophils Relative %: 42 %
PLATELETS: 228 10*3/uL (ref 140–400)
RBC: 4.92 MIL/uL (ref 4.20–5.80)
RDW: 19.2 % — ABNORMAL HIGH (ref 11.0–15.0)
WBC: 3.2 10*3/uL — ABNORMAL LOW (ref 3.8–10.8)

## 2016-12-14 LAB — FERRITIN: Ferritin: 244 ng/mL (ref 20–380)

## 2016-12-15 ENCOUNTER — Ambulatory Visit (INDEPENDENT_AMBULATORY_CARE_PROVIDER_SITE_OTHER): Payer: Medicare Other | Admitting: Gastroenterology

## 2016-12-15 ENCOUNTER — Encounter: Payer: Self-pay | Admitting: Gastroenterology

## 2016-12-15 VITALS — BP 134/77 | HR 80 | Temp 97.4°F | Ht 69.0 in | Wt 217.0 lb

## 2016-12-15 DIAGNOSIS — D509 Iron deficiency anemia, unspecified: Secondary | ICD-10-CM

## 2016-12-15 NOTE — Progress Notes (Signed)
Primary Care Physician: [name], MD  Primary Gastroenterologist:  Garfield Cornea, MD   Chief Complaint  Patient presents with  . Anemia    f/u, doing ok    HPI: Bruce Duarte is a 81 y.o. male here for follow-up of IDA. He was last seen in February.He had TCS/EGD in 04/2016. 27mm tubular adenoma removed from splenic flexure, multiple diverticula. No future colonoscopies recommended for screening/surveillance purposes, egd with few 57mm antral erosions, benign bx with no h.pylori. Capsule endoscopy performed in April without anything to explain iron deficiency anemia. He has been evaluated by hematology and has received 2 iron infusions since May. Labs from yesterday reveal ferritin of 244, hemoglobin 13.4.  Clinically he is doing well. He denies abdominal pain, constipation, diarrhea, melena, rectal bleeding. No heartburn, dysphagia, vomiting, unintentional weight loss. He takes aspirin 81 mg daily. No other NSAIDs.  Current Outpatient Prescriptions  Medication Sig Dispense Refill  . aspirin EC 81 MG tablet Take 81 mg by mouth daily.    Marland Kitchen docusate sodium (COLACE) 100 MG capsule Take 200 mg by mouth daily.    Marland Kitchen losartan-hydrochlorothiazide (HYZAAR) 100-25 MG per tablet Take 1 tablet by mouth every morning.     . metFORMIN (GLUCOPHAGE) 500 MG tablet Take 2 tablets by mouth daily.    . Misc Natural Products (GINSENG COMPLEX PO) Take 1 capsule by mouth daily.     . Multiple Vitamins-Minerals (MULTIVITAMIN GUMMIES MENS PO) Take 1 tablet by mouth daily.    . potassium chloride SA (K-DUR,KLOR-CON) 20 MEQ tablet Take 1 tablet (20 mEq total) by mouth 2 (two) times daily. (Patient taking differently: Take 20 mEq by mouth daily. ) 30 tablet 0  . tamsulosin (FLOMAX) 0.4 MG CAPS capsule Take 0.4 mg by mouth daily.     No current facility-administered medications for this visit.     Allergies as of 12/15/2016  . (No Known Allergies)    ROS:  General: Negative for anorexia, weight  loss, fever, chills, fatigue, weakness. ENT: Negative for hoarseness, difficulty swallowing , nasal congestion. CV: Negative for chest pain, angina, palpitations, dyspnea on exertion, peripheral edema.  Respiratory: Negative for dyspnea at rest, dyspnea on exertion, cough, sputum, wheezing.  GI: See history of present illness. GU:  Negative for dysuria, hematuria, urinary incontinence, urinary frequency, nocturnal urination.  Endo: Negative for unusual weight change.    Physical Examination:   BP 134/77   Pulse 80   Temp (!) 97.4 F (36.3 C) (Oral)   Ht 5\' 9"  (1.753 m)   Wt 217 lb (98.4 kg)   BMI 32.05 kg/m   General: Well-nourished, well-developed in no acute distress.  Eyes: No icterus. Mouth: Oropharyngeal mucosa moist and pink , no lesions erythema or exudate. Lungs: Clear to auscultation bilaterally.  Heart: Regular rate and rhythm, no murmurs rubs or gallops.  Abdomen: Bowel sounds are normal, nontender, nondistended, no hepatosplenomegaly or masses, no abdominal bruits or hernia , no rebound or guarding.   Extremities: No lower extremity edema. No clubbing or deformities. Neuro: Alert and oriented x 4   Skin: Warm and dry, no jaundice.   Psych: Alert and cooperative, normal mood and affect.  Labs:  Lab Results  Component Value Date   WBC 3.2 (L) 12/13/2016   HGB 13.4 12/13/2016   HCT 40.8 12/13/2016   MCV 82.9 12/13/2016   PLT 228 12/13/2016   Lab Results  Component Value Date   FERRITIN 244 12/13/2016   Lab Results  Component Value Date   WBC 3.2 (L) 12/13/2016   HGB 13.4 12/13/2016   HCT 40.8 12/13/2016   MCV 82.9 12/13/2016   PLT 228 12/13/2016   Lab Results  Component Value Date   CREATININE 1.06 11/10/2016   BUN 16 11/10/2016   NA 138 11/10/2016   K 3.2 (L) 11/10/2016   CL 101 11/10/2016   CO2 27 11/10/2016   Lab Results  Component Value Date   ALT 27 09/29/2016   AST 35 09/29/2016   ALKPHOS 52 09/29/2016   BILITOT 0.5 09/29/2016     Imaging Studies: No results found.

## 2016-12-15 NOTE — Progress Notes (Signed)
Labs improved. Patient now being followed by hematology so subsequent anemia labs to be done by them. Patient has ov today will discuss labs at that time.

## 2016-12-15 NOTE — Assessment & Plan Note (Signed)
From a GI standpoint he is doing well. He is now being followed by hematology for iron deficiency anemia. Has received 2 iron infusions. Labs improved. Denies overt GI bleeding. At this point we will plan to see him on an as-needed basis only. Explained to the patient if his iron needs increase, he develops any abdominal pain, bowel concerns he should let us know.

## 2016-12-15 NOTE — Patient Instructions (Signed)
1. Continue to follow with [facility] for your iron deficiency anemia. 2. Return for office as needed or call with any questions or concerns.

## 2016-12-15 NOTE — Progress Notes (Signed)
CC'D TO PCP °

## 2016-12-15 NOTE — Progress Notes (Signed)
PT was seen today

## 2017-02-01 ENCOUNTER — Other Ambulatory Visit (HOSPITAL_COMMUNITY): Payer: Self-pay | Admitting: *Deleted

## 2017-02-01 DIAGNOSIS — D508 Other iron deficiency anemias: Secondary | ICD-10-CM

## 2017-02-02 ENCOUNTER — Encounter (HOSPITAL_COMMUNITY): Payer: Self-pay | Admitting: Oncology

## 2017-02-02 ENCOUNTER — Encounter (HOSPITAL_COMMUNITY): Payer: Medicare Other | Attending: Oncology

## 2017-02-02 ENCOUNTER — Encounter (HOSPITAL_BASED_OUTPATIENT_CLINIC_OR_DEPARTMENT_OTHER): Payer: Medicare Other | Admitting: Oncology

## 2017-02-02 VITALS — BP 117/65 | HR 84 | Resp 16 | Ht 69.0 in | Wt 220.4 lb

## 2017-02-02 DIAGNOSIS — D509 Iron deficiency anemia, unspecified: Secondary | ICD-10-CM

## 2017-02-02 DIAGNOSIS — D508 Other iron deficiency anemias: Secondary | ICD-10-CM

## 2017-02-02 LAB — CBC WITH DIFFERENTIAL/PLATELET
BASOS ABS: 0 10*3/uL (ref 0.0–0.1)
Basophils Relative: 1 %
Eosinophils Absolute: 0.2 10*3/uL (ref 0.0–0.7)
Eosinophils Relative: 5 %
HEMATOCRIT: 39.4 % (ref 39.0–52.0)
HEMOGLOBIN: 13.1 g/dL (ref 13.0–17.0)
Lymphocytes Relative: 40 %
Lymphs Abs: 1.4 10*3/uL (ref 0.7–4.0)
MCH: 28.4 pg (ref 26.0–34.0)
MCHC: 33.2 g/dL (ref 30.0–36.0)
MCV: 85.3 fL (ref 78.0–100.0)
Monocytes Absolute: 0.3 10*3/uL (ref 0.1–1.0)
Monocytes Relative: 7 %
NEUTROS ABS: 1.7 10*3/uL (ref 1.7–7.7)
NEUTROS PCT: 47 %
Platelets: 243 10*3/uL (ref 150–400)
RBC: 4.62 MIL/uL (ref 4.22–5.81)
RDW: 17.3 % — AB (ref 11.5–15.5)
WBC: 3.6 10*3/uL — AB (ref 4.0–10.5)

## 2017-02-02 LAB — BASIC METABOLIC PANEL
ANION GAP: 9 (ref 5–15)
BUN: 16 mg/dL (ref 6–20)
CALCIUM: 9.5 mg/dL (ref 8.9–10.3)
CHLORIDE: 101 mmol/L (ref 101–111)
CO2: 27 mmol/L (ref 22–32)
CREATININE: 1.12 mg/dL (ref 0.61–1.24)
GFR calc non Af Amer: >60 mL/min (ref >60)
Glucose, Bld: 126 mg/dL — ABNORMAL HIGH (ref 65–99)
Potassium: 3.2 mmol/L — ABNORMAL LOW (ref 3.5–5.1)
Sodium: 137 mmol/L (ref 135–145)

## 2017-02-02 LAB — IRON AND TIBC
Iron: 48 ug/dL (ref 45–182)
Saturation Ratios: 15 % — ABNORMAL LOW (ref 17.9–39.5)
TIBC: 323 ug/dL (ref 250–450)
UIBC: 275 ug/dL

## 2017-02-02 LAB — FERRITIN: FERRITIN: 79 ng/mL (ref 24–336)

## 2017-02-02 NOTE — Progress Notes (Signed)
[facility] Follow up:    Bruce Duarte, [address] [place] [address]   DIAGNOSIS:Iron Deficiency anemia  CURRENT THERAPY: Observation  INTERVAL HISTORY: Bruce Duarte 81 y.o. male returns for follow up of his iron deficiency anemia. He has been doing well. He denies any fatigue. He was chopping firewood this week and also still works as a part time Administrator. He occasionally has dyspnea with exertion. He denies any chest pain, abdominal pain, focal weakness. He has not noted any melena, hematochezia, or hematuria or any other bleeding. His hemoglobin has been stable in the past 3 months in the 13 g/dl range and he has not needed any iron infusions.   Oncology Flowsheet 10/04/2016 11/26/2016  dexamethasone (DECADRON) IJ    ferumoxytol (FERAHEME) IV 510 mg 510 mg  ondansetron (ZOFRAN) IJ    ondansetron (ZOFRAN) IV       Patient Active Problem List   Diagnosis Date Noted  . IDA (iron deficiency anemia) 07/27/2016  . History of colonic polyps 04/07/2016  . Malignant neoplasm of prostate ([facility]) 12/04/2013    has No Known Allergies.  MEDICAL HISTORY: Past Medical History:  Diagnosis Date  . Arthritis   . At risk for sleep apnea    STOP-BANG= 5   SENT TO PCP 01-22-2014  . Borderline type 2 diabetes mellitus   . Hyperlipidemia   . Hypertension   . IDA (iron deficiency anemia) 07/27/2016  . Malignant neoplasm of prostate ([facility]) 12/04/2013  . Prostate cancer ([facility]) DX  2013   Gleason 3+3=6, vol 42.34 cc  . Wears glasses     SURGICAL HISTORY: Past Surgical History:  Procedure Laterality Date  . APPENDECTOMY  age 89  . COLONOSCOPY N/A 05/05/2016   Procedure: COLONOSCOPY;  Surgeon: Daneil Dolin, MD;  [facility];  Service: Endoscopy;  Laterality: N/A;  815  . COLONOSCOPY  01/2015   Dr. Carol Ada: hyperplastic polyps. H/O tubulovillous adenoma as well.   . COLONOSCOPY W/ BIOPSIES  2009   non-cancerous  .  ESOPHAGOGASTRODUODENOSCOPY N/A 05/05/2016   Procedure: ESOPHAGOGASTRODUODENOSCOPY (EGD);  Surgeon: Daneil Dolin, MD;  [facility];  Service: Endoscopy;  Laterality: N/A;  . GIVENS CAPSULE STUDY N/A 09/06/2016   Procedure: GIVENS CAPSULE STUDY;  Surgeon: Daneil Dolin, MD;  [facility];  Service: Endoscopy;  Laterality: N/A;  7:30am (patient will arrive at 7:00am)  . PROSTATE BIOPSY  11/05/13   adenocarcinoma, gleason 6  . RADIOACTIVE SEED IMPLANT N/A 01/29/2014   Procedure: RADIOACTIVE SEED IMPLANT;  Surgeon: Arvil Persons, MD;  Location: [facility];  Service: Urology;  Laterality: N/A;     seeds implanted no seeds found in bladder    SOCIAL HISTORY: Social History   Social History  . Marital status: Married    Spouse name: N/A  . Number of children: N/A  . Years of education: N/A   Occupational History  . Not on file.   Social History Main Topics  . Smoking status: Former Smoker    Packs/day: 1.00    Years: 25.00    Types: Cigarettes, Pipe, Cigars    Quit date: 12/04/1973  . Smokeless tobacco: Never Used  . Alcohol use Yes     Comment: occasionally brandy  . Drug use: No  . Sexual activity: Not on file   Other Topics Concern  . Not on file   Social History Narrative  . No narrative  on file    FAMILY HISTORY: Family History  Problem Relation Age of Onset  . Diabetes Mother   . Stroke Father   . Cancer Cousin        prostate  . Colon cancer Neg Hx     Review of Systems - Oncology  ROS as per HPI otherwise 12 point ROS is negative.  PHYSICAL EXAMINATION  Vitals:   02/02/17 1355  BP: 117/65  Pulse: 84  Resp: 16  SpO2: 97%    Physical Exam Constitutional: Well-developed, well-nourished, and in no distress.   HENT:  Head: Normocephalic and atraumatic.  Mouth/Throat: No oropharyngeal exudate. Mucosa moist. Eyes: Pupils are equal, round, and reactive to light. Conjunctivae are normal. No scleral icterus.  Neck: Normal  range of motion. Neck supple. No JVD present.  Cardiovascular: Normal rate, regular rhythm and normal heart sounds.  Exam reveals no gallop and no friction rub.   No murmur heard. Pulmonary/Chest: Effort normal and breath sounds normal. No respiratory distress. No wheezes.No rales.  Abdominal: Soft. Bowel sounds are normal. No distension. There is no tenderness. There is no guarding.  Musculoskeletal: No edema or tenderness.  Lymphadenopathy:    No cervical or supraclavicular adenopathy.  Neurological: Alert and oriented to person, place, and time. No cranial nerve deficit.  Skin: Skin is warm and dry. No rash noted. No erythema. No pallor.  Psychiatric: Affect and judgment normal.   LABORATORY DATA:  CBC    Component Value Date/Time   WBC 3.6 (L) 02/02/2017 1317   RBC 4.62 02/02/2017 1317   HGB 13.1 02/02/2017 1317   HCT 39.4 02/02/2017 1317   PLT 243 02/02/2017 1317   MCV 85.3 02/02/2017 1317   MCH 28.4 02/02/2017 1317   MCHC 33.2 02/02/2017 1317   RDW 17.3 (H) 02/02/2017 1317   LYMPHSABS 1.4 02/02/2017 1317   MONOABS 0.3 02/02/2017 1317   EOSABS 0.2 02/02/2017 1317   BASOSABS 0.0 02/02/2017 1317    CMP     Component Value Date/Time   NA 137 02/02/2017 1317   K 3.2 (L) 02/02/2017 1317   CL 101 02/02/2017 1317   CO2 27 02/02/2017 1317   GLUCOSE 126 (H) 02/02/2017 1317   BUN 16 02/02/2017 1317   CREATININE 1.12 02/02/2017 1317   CALCIUM 9.5 02/02/2017 1317   PROT 7.4 09/29/2016 1552   ALBUMIN 4.2 09/29/2016 1552   AST 35 09/29/2016 1552   ALT 27 09/29/2016 1552   ALKPHOS 52 09/29/2016 1552   BILITOT 0.5 09/29/2016 1552   GFRNONAA >60 02/02/2017 1317   GFRAA >60 02/02/2017 1317     ASSESSMENT and THERAPY PLAN:  Iron deficiency anemia  PLAN: -Reviewed patient's labs with him in detail. His hemoglobin has been stable in the 13 g/dl range in the past 3 months. Iron studies pending; however I doubt he will need iron since his hemoglobin has been in the 13 g/dl  range. -RTC in 4 months for follow up with labs.   Orders Placed This Encounter  Procedures  . CBC with Differential    Standing Status:   Future    Standing Expiration Date:   02/02/2018  . Comprehensive metabolic panel    Standing Status:   Future    Standing Expiration Date:   02/02/2018  . Iron and TIBC    Standing Status:   Future    Standing Expiration Date:   02/02/2018  . Ferritin    Standing Status:   Future    Standing Expiration Date:  02/02/2018    All questions were answered. The patient knows to call the clinic with any problems, questions or concerns. We can certainly see the patient much sooner if necessary.  This note was electronically signed. Twana First, MD 02/02/2017

## 2017-02-03 ENCOUNTER — Telehealth (HOSPITAL_COMMUNITY): Payer: Self-pay

## 2017-02-03 ENCOUNTER — Other Ambulatory Visit (HOSPITAL_COMMUNITY): Payer: Self-pay | Admitting: Adult Health

## 2017-02-03 ENCOUNTER — Encounter (HOSPITAL_COMMUNITY): Payer: Self-pay | Admitting: Adult Health

## 2017-02-03 DIAGNOSIS — E876 Hypokalemia: Secondary | ICD-10-CM

## 2017-02-03 MED ORDER — POTASSIUM CHLORIDE CRYS ER 20 MEQ PO TBCR: 20.0000 meq | EXTENDED_RELEASE_TABLET | Freq: Two times a day (BID) | ORAL | 1 refills | Status: DC

## 2017-02-03 NOTE — Telephone Encounter (Signed)
Patient called stating he needs a refill on potassium. Reviewed with provider. Prescription sent to pharmacy.

## 2017-02-10 ENCOUNTER — Other Ambulatory Visit (HOSPITAL_COMMUNITY): Payer: Medicare Other

## 2017-02-10 ENCOUNTER — Ambulatory Visit (HOSPITAL_COMMUNITY): Payer: Medicare Other

## 2017-02-14 DIAGNOSIS — Z23 Encounter for immunization: Secondary | ICD-10-CM | POA: Diagnosis not present

## 2017-02-14 DIAGNOSIS — R972 Elevated prostate specific antigen [PSA]: Secondary | ICD-10-CM | POA: Diagnosis not present

## 2017-02-14 DIAGNOSIS — I10 Essential (primary) hypertension: Secondary | ICD-10-CM | POA: Diagnosis not present

## 2017-02-14 DIAGNOSIS — E1169 Type 2 diabetes mellitus with other specified complication: Secondary | ICD-10-CM | POA: Diagnosis not present

## 2017-02-14 DIAGNOSIS — E876 Hypokalemia: Secondary | ICD-10-CM | POA: Diagnosis not present

## 2017-02-14 DIAGNOSIS — E782 Mixed hyperlipidemia: Secondary | ICD-10-CM | POA: Diagnosis not present

## 2017-02-15 ENCOUNTER — Encounter (HOSPITAL_COMMUNITY): Payer: Self-pay

## 2017-02-15 ENCOUNTER — Encounter (HOSPITAL_BASED_OUTPATIENT_CLINIC_OR_DEPARTMENT_OTHER): Payer: Medicare Other

## 2017-02-15 VITALS — BP 118/62 | HR 75 | Temp 97.9°F | Resp 16 | Wt 220.4 lb

## 2017-02-15 DIAGNOSIS — D509 Iron deficiency anemia, unspecified: Secondary | ICD-10-CM

## 2017-02-15 DIAGNOSIS — D500 Iron deficiency anemia secondary to blood loss (chronic): Secondary | ICD-10-CM

## 2017-02-15 MED ORDER — SODIUM CHLORIDE 0.9 % IV SOLN
INTRAVENOUS | Status: DC
  Administered 2017-02-15: 14:00:00 via INTRAVENOUS

## 2017-02-15 MED ORDER — SODIUM CHLORIDE 0.9 % IV SOLN
510.0000 mg | Freq: Once | INTRAVENOUS | Status: AC
  Administered 2017-02-15: 510 mg via INTRAVENOUS
  Filled 2017-02-15: qty 17

## 2017-02-15 NOTE — Progress Notes (Signed)
Tolerated infusion w/o adverse reaction.  Alert, in no distress.  VSS.  Discharged ambulatory in c/o spouse.  

## 2017-03-09 ENCOUNTER — Ambulatory Visit: Payer: Medicare Other | Admitting: Urology

## 2017-05-12 DIAGNOSIS — N401 Enlarged prostate with lower urinary tract symptoms: Secondary | ICD-10-CM | POA: Diagnosis not present

## 2017-05-18 ENCOUNTER — Ambulatory Visit (INDEPENDENT_AMBULATORY_CARE_PROVIDER_SITE_OTHER): Payer: Medicare Other | Admitting: Urology

## 2017-05-18 DIAGNOSIS — N5201 Erectile dysfunction due to arterial insufficiency: Secondary | ICD-10-CM

## 2017-05-18 DIAGNOSIS — C61 Malignant neoplasm of prostate: Secondary | ICD-10-CM

## 2017-05-18 DIAGNOSIS — N401 Enlarged prostate with lower urinary tract symptoms: Secondary | ICD-10-CM | POA: Diagnosis not present

## 2017-06-02 ENCOUNTER — Other Ambulatory Visit (HOSPITAL_COMMUNITY): Payer: Self-pay | Admitting: *Deleted

## 2017-06-02 DIAGNOSIS — D500 Iron deficiency anemia secondary to blood loss (chronic): Secondary | ICD-10-CM

## 2017-06-03 ENCOUNTER — Other Ambulatory Visit (HOSPITAL_COMMUNITY): Payer: Self-pay | Admitting: Oncology

## 2017-06-03 ENCOUNTER — Inpatient Hospital Stay (HOSPITAL_COMMUNITY): Payer: Medicare Other | Attending: Oncology

## 2017-06-03 DIAGNOSIS — D500 Iron deficiency anemia secondary to blood loss (chronic): Secondary | ICD-10-CM

## 2017-06-03 DIAGNOSIS — D509 Iron deficiency anemia, unspecified: Secondary | ICD-10-CM | POA: Insufficient documentation

## 2017-06-03 LAB — COMPREHENSIVE METABOLIC PANEL
ALT: 29 U/L (ref 17–63)
ANION GAP: 12 (ref 5–15)
AST: 38 U/L (ref 15–41)
Albumin: 4 g/dL (ref 3.5–5.0)
Alkaline Phosphatase: 44 U/L (ref 38–126)
BUN: 16 mg/dL (ref 6–20)
CHLORIDE: 99 mmol/L — AB (ref 101–111)
CO2: 26 mmol/L (ref 22–32)
Calcium: 8.8 mg/dL — ABNORMAL LOW (ref 8.9–10.3)
Creatinine, Ser: 1.15 mg/dL (ref 0.61–1.24)
GFR, EST NON AFRICAN AMERICAN: 59 mL/min — AB (ref >60)
Glucose, Bld: 171 mg/dL — ABNORMAL HIGH (ref 65–99)
Potassium: 3.3 mmol/L — ABNORMAL LOW (ref 3.5–5.1)
Sodium: 137 mmol/L (ref 135–145)
Total Bilirubin: 0.5 mg/dL (ref 0.3–1.2)
Total Protein: 6.9 g/dL (ref 6.5–8.1)

## 2017-06-03 LAB — CBC WITH DIFFERENTIAL/PLATELET
Basophils Absolute: 0 10*3/uL (ref 0.0–0.1)
Basophils Relative: 1 %
EOS PCT: 4 %
Eosinophils Absolute: 0.1 10*3/uL (ref 0.0–0.7)
HCT: 37 % — ABNORMAL LOW (ref 39.0–52.0)
Hemoglobin: 11.5 g/dL — ABNORMAL LOW (ref 13.0–17.0)
LYMPHS ABS: 1 10*3/uL (ref 0.7–4.0)
LYMPHS PCT: 29 %
MCH: 26.7 pg (ref 26.0–34.0)
MCHC: 31.1 g/dL (ref 30.0–36.0)
MCV: 86 fL (ref 78.0–100.0)
Monocytes Absolute: 0.3 10*3/uL (ref 0.1–1.0)
Monocytes Relative: 9 %
Neutro Abs: 1.9 10*3/uL (ref 1.7–7.7)
Neutrophils Relative %: 57 %
PLATELETS: 232 10*3/uL (ref 150–400)
RBC: 4.3 MIL/uL (ref 4.22–5.81)
RDW: 17.3 % — AB (ref 11.5–15.5)
WBC: 3.4 10*3/uL — ABNORMAL LOW (ref 4.0–10.5)

## 2017-06-03 LAB — IRON AND TIBC
IRON: 45 ug/dL (ref 45–182)
SATURATION RATIOS: 13 % — AB (ref 17.9–39.5)
TIBC: 339 ug/dL (ref 250–450)
UIBC: 294 ug/dL

## 2017-06-03 LAB — FERRITIN: FERRITIN: 28 ng/mL (ref 24–336)

## 2017-06-10 ENCOUNTER — Ambulatory Visit (HOSPITAL_COMMUNITY): Payer: Medicare Other

## 2017-06-14 ENCOUNTER — Inpatient Hospital Stay (HOSPITAL_BASED_OUTPATIENT_CLINIC_OR_DEPARTMENT_OTHER): Payer: Medicare Other | Admitting: Hematology and Oncology

## 2017-06-14 ENCOUNTER — Other Ambulatory Visit: Payer: Self-pay

## 2017-06-14 ENCOUNTER — Encounter (HOSPITAL_COMMUNITY): Payer: Self-pay | Admitting: Hematology and Oncology

## 2017-06-14 ENCOUNTER — Inpatient Hospital Stay (HOSPITAL_COMMUNITY): Payer: Medicare Other

## 2017-06-14 ENCOUNTER — Encounter (HOSPITAL_COMMUNITY): Payer: Self-pay

## 2017-06-14 VITALS — BP 111/49 | HR 67 | Temp 98.1°F | Resp 20 | Wt 220.6 lb

## 2017-06-14 VITALS — BP 149/71 | HR 56 | Temp 98.0°F | Resp 20

## 2017-06-14 DIAGNOSIS — D500 Iron deficiency anemia secondary to blood loss (chronic): Secondary | ICD-10-CM

## 2017-06-14 DIAGNOSIS — D509 Iron deficiency anemia, unspecified: Secondary | ICD-10-CM | POA: Diagnosis not present

## 2017-06-14 MED ORDER — SODIUM CHLORIDE 0.9 % IV SOLN
Freq: Once | INTRAVENOUS | Status: AC
  Administered 2017-06-14: 14:00:00 via INTRAVENOUS

## 2017-06-14 MED ORDER — SODIUM CHLORIDE 0.9% FLUSH
10.0000 mL | INTRAVENOUS | Status: DC | PRN
  Administered 2017-06-14: 10 mL
  Filled 2017-06-14: qty 10

## 2017-06-14 MED ORDER — SODIUM CHLORIDE 0.9 % IV SOLN
510.0000 mg | Freq: Once | INTRAVENOUS | Status: AC
  Administered 2017-06-14: 510 mg via INTRAVENOUS
  Filled 2017-06-14: qty 17

## 2017-06-14 NOTE — Patient Instructions (Signed)
[facility] at [facility]  Discharge Instructions:  You received an iron infusion today.  _______________________________________________________________  Thank you for choosing [facility] at [facility] to provide your oncology and hematology care.  To afford each patient quality time with our providers, please arrive at least 15 minutes before your scheduled appointment.  You need to re-schedule your appointment if you arrive 10 or more minutes late.  We strive to give you quality time with our providers, and arriving late affects you and other patients whose appointments are after yours.  Also, if you no show three or more times for appointments you may be dismissed from the clinic.  Again, thank you for choosing [facility] at [facility]. Our hope is that these requests will allow you access to exceptional care and in a timely manner. _______________________________________________________________  If you have questions after your visit, please contact our office at [phone] between the hours of 8:30 a.m. and 5:00 p.m. Voicemails left after 4:30 p.m. will not be returned until the following business day. _______________________________________________________________  For prescription refill requests, have your pharmacy contact our office. _______________________________________________________________  Recommendations made by the consultant and any test results will be sent to your referring physician. _______________________________________________________________ 

## 2017-06-14 NOTE — Progress Notes (Signed)
To treatment room for iron infusion.  Reviewed iron and side effects with all questions asked and answered.  Information given to carry home.  No complaints voiced.  Peripheral IV with good blood return.    Patient tolerated iron infusion with no complaints voiced.  Peripheral IV site clean and dry with no bruising or swelling noted at site.  Good blood return noted before and after administration of iron.  Band aid applied.  VSS with discharge and left ambulatory with family and no s/s of distress noted.

## 2017-06-15 DIAGNOSIS — C61 Malignant neoplasm of prostate: Secondary | ICD-10-CM | POA: Diagnosis not present

## 2017-06-15 DIAGNOSIS — D500 Iron deficiency anemia secondary to blood loss (chronic): Secondary | ICD-10-CM | POA: Diagnosis not present

## 2017-06-15 DIAGNOSIS — E1169 Type 2 diabetes mellitus with other specified complication: Secondary | ICD-10-CM | POA: Diagnosis not present

## 2017-06-15 DIAGNOSIS — N401 Enlarged prostate with lower urinary tract symptoms: Secondary | ICD-10-CM | POA: Diagnosis not present

## 2017-06-15 DIAGNOSIS — Z6833 Body mass index (BMI) 33.0-33.9, adult: Secondary | ICD-10-CM | POA: Diagnosis not present

## 2017-06-15 DIAGNOSIS — E782 Mixed hyperlipidemia: Secondary | ICD-10-CM | POA: Diagnosis not present

## 2017-06-15 DIAGNOSIS — I10 Essential (primary) hypertension: Secondary | ICD-10-CM | POA: Diagnosis not present

## 2017-06-26 NOTE — Assessment & Plan Note (Signed)
82 y.o. male with previous history of gastric erosions, diverticulosis, and colon polyps and recurrent iron deficiency anemia that is likely due to recurrent gastrointestinal bleeding.  Lab works obtained  on 06/03/17 demonstrates a significant drop in hemoglobin of about 1.6 g compared to the previous result, concurrently patient's ferritin has dropped down from 79 to a 28.  I suspect patient has recurrent bleeding, but presently denies any active symptoms to suggest ongoing GI blood loss.  No hemodynamic instability either.  Plan: -Proceed with IV iron infusion today -Repeat blood work monthly and consider transfusion or GI referral at this time if hemoglobin is continuing to decline -Return to clinic in 3 months with labs 1 week prior.

## 2017-06-26 NOTE — Progress Notes (Signed)
[facility] Follow-[facility] Visit:  Assessment: IDA (iron deficiency anemia) 82 y.o. male with previous history of gastric erosions, diverticulosis, and colon polyps and recurrent iron deficiency anemia that is likely due to recurrent gastrointestinal bleeding.  Lab works obtained  on 06/03/17 demonstrates a significant drop in hemoglobin of about 1.6 g compared to the previous result, concurrently patient's ferritin has dropped down from 79 to a 28.  I suspect patient has recurrent bleeding, but presently denies any active symptoms to suggest ongoing GI blood loss.  No hemodynamic instability either.  Plan: -Proceed with IV iron infusion today -Repeat blood work monthly and consider transfusion or GI referral at this time if hemoglobin is continuing to decline -Return to clinic in 3 months with labs 1 week prior.  Voice recognition software was used and creation of this note. Despite my best effort at editing the text, some misspelling/errors may have occurred.  Orders Placed This Encounter  Procedures  . CBC with Differential    Standing Status:   Future    Standing Expiration Date:   06/14/2018  . CBC with Differential    Standing Status:   Future    Standing Expiration Date:   06/14/2018  . Comprehensive metabolic panel    Standing Status:   Future    Standing Expiration Date:   06/14/2018  . Iron and TIBC    Standing Status:   Future    Standing Expiration Date:   06/14/2018  . Ferritin    Standing Status:   Future    Standing Expiration Date:   06/14/2018    Cancer Staging No matching staging information was found for the patient.  All questions were answered. . The patient knows to call the clinic with any problems, questions or concerns.  This note was electronically signed.    History of Presenting Illness Bruce Duarte 82 y.o. presenting to the [facility] for iron deficiency anemia monitoring.  Patient has history of diverticulosis, colon polyps  and gastric erosions likely contributing to gastrointestinal blood loss.  At this time, patient reports feeling well with preserved energy level, denies any melena, hematochezia, or hematemesis.  No abdominal pain, nausea, or vomiting..  Oncological/hematological History:  No history exists.    Medical History: Past Medical History:  Diagnosis Date  . Arthritis   . At risk for sleep apnea    STOP-BANG= 5   SENT TO PCP 01-22-2014  . Borderline type 2 diabetes mellitus   . Hyperlipidemia   . Hypertension   . IDA (iron deficiency anemia) 07/27/2016  . Malignant neoplasm of prostate ([facility]) 12/04/2013  . Prostate cancer ([facility]) DX  2013   Gleason 3+3=6, vol 42.34 cc  . Wears glasses     Surgical History: Past Surgical History:  Procedure Laterality Date  . APPENDECTOMY  age 73  . COLONOSCOPY N/A 05/05/2016   Procedure: COLONOSCOPY;  Surgeon: Daneil Dolin, MD;  [facility];  Service: Endoscopy;  Laterality: N/A;  815  . COLONOSCOPY  01/2015   Dr. Carol Ada: hyperplastic polyps. H/O tubulovillous adenoma as well.   . COLONOSCOPY W/ BIOPSIES  2009   non-cancerous  . ESOPHAGOGASTRODUODENOSCOPY N/A 05/05/2016   Procedure: ESOPHAGOGASTRODUODENOSCOPY (EGD);  Surgeon: Daneil Dolin, MD;  [facility];  Service: Endoscopy;  Laterality: N/A;  . GIVENS CAPSULE STUDY N/A 09/06/2016   Procedure: GIVENS CAPSULE STUDY;  Surgeon: Daneil Dolin, MD;  [facility];  Service: Endoscopy;  Laterality: N/A;  7:30am (patient will arrive at 7:00am)  . PROSTATE BIOPSY  11/05/13   adenocarcinoma, gleason 6  . RADIOACTIVE SEED IMPLANT N/A 01/29/2014   Procedure: RADIOACTIVE SEED IMPLANT;  Surgeon: Arvil Persons, MD;  Location: [facility];  Service: Urology;  Laterality: N/A;     seeds implanted no seeds found in bladder    Family History: Family History  Problem Relation Age of Onset  . Diabetes Mother   . Stroke Father   . Cancer Cousin        prostate  .  Colon cancer Neg Hx     Social History: Social History   Socioeconomic History  . Marital status: Married    Spouse name: Not on file  . Number of children: Not on file  . Years of education: Not on file  . Highest education level: Not on file  Social Needs  . Financial resource strain: Not on file  . Food insecurity - worry: Not on file  . Food insecurity - inability: Not on file  . Transportation needs - medical: Not on file  . Transportation needs - non-medical: Not on file  Occupational History  . Not on file  Tobacco Use  . Smoking status: Former Smoker    Packs/day: 1.00    Years: 25.00    Pack years: 25.00    Types: Cigarettes, Pipe, Cigars    Last attempt to quit: 12/04/1973    Years since quitting: 43.5  . Smokeless tobacco: Never Used  Substance and Sexual Activity  . Alcohol use: Yes    Comment: occasionally brandy  . Drug use: No  . Sexual activity: Not on file  Other Topics Concern  . Not on file  Social History Narrative  . Not on file    Allergies: No Known Allergies  Medications:  Current Outpatient Medications  Medication Sig Dispense Refill  . aspirin EC 81 MG tablet Take 81 mg by mouth daily.    Marland Kitchen docusate sodium (COLACE) 100 MG capsule Take 200 mg by mouth daily.    Marland Kitchen losartan-hydrochlorothiazide (HYZAAR) 100-25 MG per tablet Take 1 tablet by mouth every morning.     . metFORMIN (GLUCOPHAGE) 500 MG tablet Take 2 tablets by mouth daily.    . Misc Natural Products (GINSENG COMPLEX PO) Take 1 capsule by mouth daily.     . Multiple Vitamins-Minerals (MULTIVITAMIN GUMMIES MENS PO) Take 1 tablet by mouth daily.    . potassium chloride SA (K-DUR,KLOR-CON) 20 MEQ tablet Take 1 tablet (20 mEq total) by mouth 2 (two) times daily. 60 tablet 1  . sildenafil (REVATIO) 20 MG tablet TAKE THREE TABLETS BY MOUTH AS NEEDED  1  . tamsulosin (FLOMAX) 0.4 MG CAPS capsule Take 0.4 mg by mouth daily.     No current facility-administered medications for this visit.      Review of Systems: Review of Systems  All other systems reviewed and are negative.    PHYSICAL EXAMINATION Blood pressure (!) 149/71, pulse (!) 56, temperature 98 F (36.7 C), temperature source Oral, resp. rate 20, SpO2 99 %.  ECOG PERFORMANCE STATUS: 0 - Asymptomatic  Physical Exam  Constitutional: He is oriented to person, place, and time and well-developed, well-nourished, and in no distress. No distress.  HENT:  Head: Normocephalic and atraumatic.  Mouth/Throat: Oropharynx is clear and moist. No oropharyngeal exudate.  Eyes: Conjunctivae and EOM are normal. Pupils are equal, round, and reactive to light. No scleral icterus.  Neck: No thyromegaly present.  Cardiovascular:  Normal rate, regular rhythm and normal heart sounds.  No murmur heard. Pulmonary/Chest: Effort normal and breath sounds normal. No respiratory distress. He has no wheezes. He has no rales.  Abdominal: Bowel sounds are normal. He exhibits no distension and no mass. There is no tenderness. There is no guarding.  Musculoskeletal: He exhibits no edema.  Lymphadenopathy:    He has no cervical adenopathy.  Neurological: He is alert and oriented to person, place, and time. He has normal reflexes. No cranial nerve deficit.  Skin: Skin is warm and dry. No rash noted. He is not diaphoretic. No erythema.     LABORATORY DATA: I have personally reviewed the data as listed: No visits with results within 1 Week(s) from this visit.  Latest known visit with results is:  Appointment on 06/03/2017  Component Date Value Ref Range Status  . WBC 06/03/2017 3.4* 4.0 - 10.5 K/uL Final  . RBC 06/03/2017 4.30  4.22 - 5.81 MIL/uL Final  . Hemoglobin 06/03/2017 11.5* 13.0 - 17.0 g/dL Final  . HCT 06/03/2017 37.0* 39.0 - 52.0 % Final  . MCV 06/03/2017 86.0  78.0 - 100.0 fL Final  . MCH 06/03/2017 26.7  26.0 - 34.0 pg Final  . MCHC 06/03/2017 31.1  30.0 - 36.0 g/dL Final  . RDW 06/03/2017 17.3* 11.5 - 15.5 % Final  .  Platelets 06/03/2017 232  150 - 400 K/uL Final  . Neutrophils Relative % 06/03/2017 57  % Final  . Neutro Abs 06/03/2017 1.9  1.7 - 7.7 K/uL Final  . Lymphocytes Relative 06/03/2017 29  % Final  . Lymphs Abs 06/03/2017 1.0  0.7 - 4.0 K/uL Final  . Monocytes Relative 06/03/2017 9  % Final  . Monocytes Absolute 06/03/2017 0.3  0.1 - 1.0 K/uL Final  . Eosinophils Relative 06/03/2017 4  % Final  . Eosinophils Absolute 06/03/2017 0.1  0.0 - 0.7 K/uL Final  . Basophils Relative 06/03/2017 1  % Final  . Basophils Absolute 06/03/2017 0.0  0.0 - 0.1 K/uL Final  . Sodium 06/03/2017 137  135 - 145 mmol/L Final  . Potassium 06/03/2017 3.3* 3.5 - 5.1 mmol/L Final  . Chloride 06/03/2017 99* 101 - 111 mmol/L Final  . CO2 06/03/2017 26  22 - 32 mmol/L Final  . Glucose, Bld 06/03/2017 171* 65 - 99 mg/dL Final  . BUN 06/03/2017 16  6 - 20 mg/dL Final  . Creatinine, Ser 06/03/2017 1.15  0.61 - 1.24 mg/dL Final  . Calcium 06/03/2017 8.8* 8.9 - 10.3 mg/dL Final  . Total Protein 06/03/2017 6.9  6.5 - 8.1 g/dL Final  . Albumin 06/03/2017 4.0  3.5 - 5.0 g/dL Final  . AST 06/03/2017 38  15 - 41 U/L Final  . ALT 06/03/2017 29  17 - 63 U/L Final  . Alkaline Phosphatase 06/03/2017 44  38 - 126 U/L Final  . Total Bilirubin 06/03/2017 0.5  0.3 - 1.2 mg/dL Final  . GFR calc non Af Amer 06/03/2017 59* >60 mL/min Final  . GFR calc Af Amer 06/03/2017 >60  >60 mL/min Final   Comment: (NOTE) The eGFR has been calculated using the CKD EPI equation. This calculation has not been validated in all clinical situations. eGFR's persistently <60 mL/min signify possible Chronic Kidney Disease.   . Anion gap 06/03/2017 12  5 - 15 Final  . Iron 06/03/2017 45  45 - 182 ug/dL Final  . TIBC 06/03/2017 339  250 - 450 ug/dL Final  . Saturation Ratios 06/03/2017 13* 17.9 -   39.5 % Final  . UIBC 06/03/2017 294  ug/dL Final   Performed at [facility], [place] [address] [place], [place] [address]  . Ferritin 06/03/2017 28  24 -  336 ng/mL Final   Performed at [facility], [place] [address] [place], [place] [address]       Ardath Sax, MD

## 2017-07-15 ENCOUNTER — Inpatient Hospital Stay (HOSPITAL_COMMUNITY): Payer: Medicare Other | Attending: Internal Medicine

## 2017-07-15 DIAGNOSIS — D509 Iron deficiency anemia, unspecified: Secondary | ICD-10-CM | POA: Diagnosis not present

## 2017-07-15 DIAGNOSIS — D500 Iron deficiency anemia secondary to blood loss (chronic): Secondary | ICD-10-CM

## 2017-07-15 LAB — CBC WITH DIFFERENTIAL/PLATELET
Basophils Absolute: 0 10*3/uL (ref 0.0–0.1)
Basophils Relative: 1 %
EOS ABS: 0.1 10*3/uL (ref 0.0–0.7)
EOS PCT: 4 %
HCT: 36.9 % — ABNORMAL LOW (ref 39.0–52.0)
HEMOGLOBIN: 11.4 g/dL — AB (ref 13.0–17.0)
LYMPHS ABS: 1.2 10*3/uL (ref 0.7–4.0)
LYMPHS PCT: 36 %
MCH: 27.1 pg (ref 26.0–34.0)
MCHC: 30.9 g/dL (ref 30.0–36.0)
MCV: 87.6 fL (ref 78.0–100.0)
MONOS PCT: 11 %
Monocytes Absolute: 0.4 10*3/uL (ref 0.1–1.0)
Neutro Abs: 1.7 10*3/uL (ref 1.7–7.7)
Neutrophils Relative %: 48 %
PLATELETS: 230 10*3/uL (ref 150–400)
RBC: 4.21 MIL/uL — ABNORMAL LOW (ref 4.22–5.81)
RDW: 19.2 % — ABNORMAL HIGH (ref 11.5–15.5)
WBC: 3.4 10*3/uL — ABNORMAL LOW (ref 4.0–10.5)

## 2017-09-05 ENCOUNTER — Inpatient Hospital Stay (HOSPITAL_COMMUNITY): Payer: Medicare Other | Attending: Internal Medicine

## 2017-09-05 DIAGNOSIS — D500 Iron deficiency anemia secondary to blood loss (chronic): Secondary | ICD-10-CM

## 2017-09-05 DIAGNOSIS — Z8546 Personal history of malignant neoplasm of prostate: Secondary | ICD-10-CM | POA: Insufficient documentation

## 2017-09-05 DIAGNOSIS — D509 Iron deficiency anemia, unspecified: Secondary | ICD-10-CM | POA: Insufficient documentation

## 2017-09-05 LAB — COMPREHENSIVE METABOLIC PANEL
ALBUMIN: 4.2 g/dL (ref 3.5–5.0)
ALT: 30 U/L (ref 17–63)
ANION GAP: 11 (ref 5–15)
AST: 33 U/L (ref 15–41)
Alkaline Phosphatase: 48 U/L (ref 38–126)
BILIRUBIN TOTAL: 0.5 mg/dL (ref 0.3–1.2)
BUN: 15 mg/dL (ref 6–20)
CHLORIDE: 102 mmol/L (ref 101–111)
CO2: 26 mmol/L (ref 22–32)
Calcium: 9.2 mg/dL (ref 8.9–10.3)
Creatinine, Ser: 1.17 mg/dL (ref 0.61–1.24)
GFR calc Af Amer: >60 mL/min (ref >60)
GFR, EST NON AFRICAN AMERICAN: 57 mL/min — AB (ref >60)
GLUCOSE: 99 mg/dL (ref 65–99)
POTASSIUM: 3.3 mmol/L — AB (ref 3.5–5.1)
Sodium: 139 mmol/L (ref 135–145)
TOTAL PROTEIN: 7.3 g/dL (ref 6.5–8.1)

## 2017-09-05 LAB — CBC WITH DIFFERENTIAL/PLATELET
BASOS ABS: 0.1 10*3/uL (ref 0.0–0.1)
BASOS PCT: 1 %
EOS PCT: 4 %
Eosinophils Absolute: 0.2 10*3/uL (ref 0.0–0.7)
HCT: 35.6 % — ABNORMAL LOW (ref 39.0–52.0)
Hemoglobin: 10.9 g/dL — ABNORMAL LOW (ref 13.0–17.0)
Lymphocytes Relative: 33 %
Lymphs Abs: 1.2 10*3/uL (ref 0.7–4.0)
MCH: 25.6 pg — ABNORMAL LOW (ref 26.0–34.0)
MCHC: 30.6 g/dL (ref 30.0–36.0)
MCV: 83.8 fL (ref 78.0–100.0)
MONO ABS: 0.4 10*3/uL (ref 0.1–1.0)
MONOS PCT: 11 %
Neutro Abs: 1.8 10*3/uL (ref 1.7–7.7)
Neutrophils Relative %: 51 %
PLATELETS: 273 10*3/uL (ref 150–400)
RBC: 4.25 MIL/uL (ref 4.22–5.81)
RDW: 17.9 % — AB (ref 11.5–15.5)
WBC: 3.7 10*3/uL — ABNORMAL LOW (ref 4.0–10.5)

## 2017-09-05 LAB — IRON AND TIBC
IRON: 33 ug/dL — AB (ref 45–182)
SATURATION RATIOS: 8 % — AB (ref 17.9–39.5)
TIBC: 405 ug/dL (ref 250–450)
UIBC: 372 ug/dL

## 2017-09-05 LAB — FERRITIN: FERRITIN: 10 ng/mL — AB (ref 24–336)

## 2017-09-12 ENCOUNTER — Other Ambulatory Visit: Payer: Self-pay

## 2017-09-12 ENCOUNTER — Encounter (HOSPITAL_COMMUNITY): Payer: Self-pay | Admitting: Hematology

## 2017-09-12 ENCOUNTER — Inpatient Hospital Stay (HOSPITAL_BASED_OUTPATIENT_CLINIC_OR_DEPARTMENT_OTHER): Payer: Medicare Other | Admitting: Hematology

## 2017-09-12 VITALS — BP 144/86 | HR 85 | Resp 16 | Wt 220.5 lb

## 2017-09-12 DIAGNOSIS — Z8546 Personal history of malignant neoplasm of prostate: Secondary | ICD-10-CM

## 2017-09-12 DIAGNOSIS — D508 Other iron deficiency anemias: Secondary | ICD-10-CM

## 2017-09-12 DIAGNOSIS — D509 Iron deficiency anemia, unspecified: Secondary | ICD-10-CM | POA: Diagnosis not present

## 2017-09-12 NOTE — Progress Notes (Signed)
[facility], [place] [address]   CLINIC:  Medical Oncology/Hematology  PCP:  [name], [facility] [address] [place] [address] [phone]   REASON FOR VISIT:  Follow-up for iron deficiency anemia.  CURRENT THERAPY: Intermittent Feraheme infusions.   INTERVAL HISTORY:  Mr. Graham 82 y.o. male returns for routine follow-up for his iron deficiency anemia.  He denies any bleeding per rectum or melena.  Denies any recent transfusions.  Denies any hospitalizations.  No fevers, night sweats or weight loss was reported.  He is accompanied by his wife today.    REVIEW OF SYSTEMS:  Review of Systems  Constitutional: Positive for fatigue.  HENT:  Negative.   Respiratory: Negative.   Cardiovascular: Negative.   Gastrointestinal: Negative.   Genitourinary: Negative.    Musculoskeletal: Negative.   Skin: Negative.   Neurological: Negative.   Hematological: Negative.   Psychiatric/Behavioral: Negative.      PAST MEDICAL/SURGICAL HISTORY:  Past Medical History:  Diagnosis Date  . Arthritis   . At risk for sleep apnea    STOP-BANG= 5   SENT TO PCP 01-22-2014  . Borderline type 2 diabetes mellitus   . Hyperlipidemia   . Hypertension   . IDA (iron deficiency anemia) 07/27/2016  . Malignant neoplasm of prostate ([place]) 12/04/2013  . Prostate cancer ([place]) DX  2013   Gleason 3+3=6, vol 42.34 cc  . Wears glasses    Past Surgical History:  Procedure Laterality Date  . APPENDECTOMY  age 62  . COLONOSCOPY N/A 05/05/2016   Procedure: COLONOSCOPY;  Surgeon: Daneil Dolin, MD;  [facility];  Service: Endoscopy;  Laterality: N/A;  815  . COLONOSCOPY  01/2015   Dr. Carol Ada: hyperplastic polyps. H/O tubulovillous adenoma as well.   . COLONOSCOPY W/ BIOPSIES  2009   non-cancerous  . ESOPHAGOGASTRODUODENOSCOPY N/A 05/05/2016   Procedure: ESOPHAGOGASTRODUODENOSCOPY (EGD);  Surgeon: Daneil Dolin, MD;  [facility];   Service: Endoscopy;  Laterality: N/A;  . GIVENS CAPSULE STUDY N/A 09/06/2016   Procedure: GIVENS CAPSULE STUDY;  Surgeon: Daneil Dolin, MD;  [facility];  Service: Endoscopy;  Laterality: N/A;  7:30am (patient will arrive at 7:00am)  . PROSTATE BIOPSY  11/05/13   adenocarcinoma, gleason 6  . RADIOACTIVE SEED IMPLANT N/A 01/29/2014   Procedure: RADIOACTIVE SEED IMPLANT;  Surgeon: Arvil Persons, MD;  Location: [facility];  Service: Urology;  Laterality: N/A;     seeds implanted no seeds found in bladder     SOCIAL HISTORY:  Social History   Socioeconomic History  . Marital status: Married    Spouse name: Not on file  . Number of children: Not on file  . Years of education: Not on file  . Highest education level: Not on file  Occupational History  . Not on file  Social Needs  . Financial resource strain: Not on file  . Food insecurity:    Worry: Not on file    Inability: Not on file  . Transportation needs:    Medical: Not on file    Non-medical: Not on file  Tobacco Use  . Smoking status: Former Smoker    Packs/day: 1.00    Years: 25.00    Pack years: 25.00    Types: Cigarettes, Pipe, Cigars    Last attempt to quit: 12/04/1973    Years since quitting: 43.8  . Smokeless tobacco: Never Used  Substance and  Sexual Activity  . Alcohol use: Yes    Comment: occasionally brandy  . Drug use: No  . Sexual activity: Not on file  Lifestyle  . Physical activity:    Days per week: Not on file    Minutes per session: Not on file  . Stress: Not on file  Relationships  . Social connections:    Talks on phone: Not on file    Gets together: Not on file    Attends religious service: Not on file    Active member of club or organization: Not on file    Attends meetings of clubs or organizations: Not on file    Relationship status: Not on file  . Intimate partner violence:    Fear of current or ex partner: Not on file    Emotionally abused: Not on file     Physically abused: Not on file    Forced sexual activity: Not on file  Other Topics Concern  . Not on file  Social History Narrative  . Not on file    FAMILY HISTORY:  Family History  Problem Relation Age of Onset  . Diabetes Mother   . Stroke Father   . Cancer Cousin        prostate  . Colon cancer Neg Hx     CURRENT MEDICATIONS:  Outpatient Encounter Medications as of 09/12/2017  Medication Sig  . aspirin EC 81 MG tablet Take 81 mg by mouth daily.  Marland Kitchen docusate sodium (COLACE) 100 MG capsule Take 200 mg by mouth daily.  Marland Kitchen losartan-hydrochlorothiazide (HYZAAR) 100-25 MG per tablet Take 1 tablet by mouth every morning.   . metFORMIN (GLUCOPHAGE) 500 MG tablet Take 2 tablets by mouth daily.  . Misc Natural Products (GINSENG COMPLEX PO) Take 1 capsule by mouth daily.   . Multiple Vitamins-Minerals (MULTIVITAMIN GUMMIES MENS PO) Take 1 tablet by mouth daily.  . potassium chloride SA (K-DUR,KLOR-CON) 20 MEQ tablet Take 1 tablet (20 mEq total) by mouth 2 (two) times daily.  . sildenafil (REVATIO) 20 MG tablet TAKE THREE TABLETS BY MOUTH AS NEEDED  . tamsulosin (FLOMAX) 0.4 MG CAPS capsule Take 0.4 mg by mouth daily.   No facility-administered encounter medications on file as of 09/12/2017.     ALLERGIES:  No Known Allergies   PHYSICAL EXAM:  ECOG Performance status: 1  Vitals:   09/12/17 1510  BP: (!) 144/86  Pulse: 85  Resp: 16  SpO2: 100%   Filed Weights   09/12/17 1510  Weight: 220 lb 8 oz (100 kg)     LABORATORY DATA:  I have reviewed the labs as listed.  CBC    Component Value Date/Time   WBC 3.7 (L) 09/05/2017 1316   RBC 4.25 09/05/2017 1316   HGB 10.9 (L) 09/05/2017 1316   HCT 35.6 (L) 09/05/2017 1316   PLT 273 09/05/2017 1316   MCV 83.8 09/05/2017 1316   MCH 25.6 (L) 09/05/2017 1316   MCHC 30.6 09/05/2017 1316   RDW 17.9 (H) 09/05/2017 1316   LYMPHSABS 1.2 09/05/2017 1316   MONOABS 0.4 09/05/2017 1316   EOSABS 0.2 09/05/2017 1316   BASOSABS 0.1  09/05/2017 1316   CMP Latest Ref Rng & Units 09/05/2017 06/03/2017 02/02/2017  Glucose 65 - 99 mg/dL 99 171(H) 126(H)  BUN 6 - 20 mg/dL 15 16 16   Creatinine 0.61 - 1.24 mg/dL 1.17 1.15 1.12  Sodium 135 - 145 mmol/L 139 137 137  Potassium 3.5 - 5.1 mmol/L 3.3(L) 3.3(L) 3.2(L)  Chloride  101 - 111 mmol/L 102 99(L) 101  CO2 22 - 32 mmol/L 26 26 27   Calcium 8.9 - 10.3 mg/dL 9.2 8.8(L) 9.5  Total Protein 6.5 - 8.1 g/dL 7.3 6.9 -  Total Bilirubin 0.3 - 1.2 mg/dL 0.5 0.5 -  Alkaline Phos 38 - 126 U/L 48 44 -  AST 15 - 41 U/L 33 38 -  ALT 17 - 63 U/L 30 29 -        ASSESSMENT & PLAN:   IDA (iron deficiency anemia) 1.  Iron deficiency anemia: - Previous history of gastric erosions, diverticulosis and polyps, on intermittent Feraheme infusions, last infusion on 06/14/2017 for a ferritin of 28. -His hemoglobin dropped to 10.9 today from 11.5 last visit.  His ferritin dropped to 10 from 28 at last visit.  Hence I have recommended 2 infusions of weekly Feraheme.  He denies any bleeding per rectum or melena.  We will recheck him back in 3 months.  2.  Prostate cancer: He underwent seed implants 3 years ago.  He follows up with his urologist.  Reportedly his PSA was undetectable.      Orders placed this encounter:  Orders Placed This Encounter  Procedures  . CBC  . Iron and TIBC  . Ferritin  . Vitamin B12  . Folate      Derek Jack, MD [place] [phone]

## 2017-09-12 NOTE — Assessment & Plan Note (Signed)
1.  Iron deficiency anemia: - Previous history of gastric erosions, diverticulosis and polyps, on intermittent Feraheme infusions, last infusion on 06/14/2017 for a ferritin of 28. -His hemoglobin dropped to 10.9 today from 11.5 last visit.  His ferritin dropped to 10 from 28 at last visit.  Hence I have recommended 2 infusions of weekly Feraheme.  He denies any bleeding per rectum or melena.  We will recheck him back in 3 months.  2.  Prostate cancer: He underwent seed implants 3 years ago.  He follows up with his urologist.  Reportedly his PSA was undetectable.

## 2017-09-19 ENCOUNTER — Ambulatory Visit (HOSPITAL_COMMUNITY): Payer: Medicare Other

## 2017-09-23 ENCOUNTER — Encounter (HOSPITAL_COMMUNITY): Payer: Self-pay

## 2017-09-23 ENCOUNTER — Other Ambulatory Visit: Payer: Self-pay

## 2017-09-23 ENCOUNTER — Inpatient Hospital Stay (HOSPITAL_COMMUNITY): Payer: Medicare Other

## 2017-09-23 VITALS — BP 125/65 | HR 78 | Temp 98.4°F | Resp 16

## 2017-09-23 DIAGNOSIS — D509 Iron deficiency anemia, unspecified: Secondary | ICD-10-CM | POA: Diagnosis not present

## 2017-09-23 DIAGNOSIS — D500 Iron deficiency anemia secondary to blood loss (chronic): Secondary | ICD-10-CM

## 2017-09-23 DIAGNOSIS — Z8546 Personal history of malignant neoplasm of prostate: Secondary | ICD-10-CM | POA: Diagnosis not present

## 2017-09-23 MED ORDER — SODIUM CHLORIDE 0.9 % IV SOLN
Freq: Once | INTRAVENOUS | Status: AC
  Administered 2017-09-23: 12:00:00 via INTRAVENOUS

## 2017-09-23 MED ORDER — SODIUM CHLORIDE 0.9 % IV SOLN
510.0000 mg | Freq: Once | INTRAVENOUS | Status: AC
  Administered 2017-09-23: 510 mg via INTRAVENOUS
  Filled 2017-09-23: qty 17

## 2017-09-23 NOTE — Patient Instructions (Signed)
[facility] at [facility] Discharge Instructions  Feraheme given today. Follow up as scheduled.   Thank you for choosing [facility] at [facility] to provide your oncology and hematology care.  To afford each patient quality time with our provider, please arrive at least 15 minutes before your scheduled appointment time.   If you have a lab appointment with the Cancer Center please come in thru the  Main Entrance and check in at the main information desk  You need to re-schedule your appointment should you arrive 10 or more minutes late.  We strive to give you quality time with our providers, and arriving late affects you and other patients whose appointments are after yours.  Also, if you no show three or more times for appointments you may be dismissed from the clinic at the providers discretion.     Again, thank you for choosing [facility].  Our hope is that these requests will decrease the amount of time that you wait before being seen by our physicians.       _____________________________________________________________  Should you have questions after your visit to [facility], please contact our office at [phone] between the hours of 8:30 a.m. and 4:30 p.m.  Voicemails left after 4:30 p.m. will not be returned until the following business day.  For prescription refill requests, have your pharmacy contact our office.       Resources For Cancer Patients and their Caregivers ? American Cancer Society: Can assist with transportation, wigs, general needs, runs Look Good Feel Better.        1-888-227-6333 ? Cancer Care: Provides financial assistance, online support groups, medication/co-pay assistance.  1-800-813-HOPE (4673) ? [facility] Assists [place] cancer patients and their families through emotional , educational and financial support.  336-427-4357 ? [place]  DSS Where to apply for food stamps, Medicaid and utility assistance. 336-342-1394 ? RCATS: Transportation to medical appointments. 336-347-2287 ? Social Security Administration: May apply for disability if have a Stage IV cancer. 336-342-7796 1-800-772-1213 ? [place] Aging, Disability and Transit Services: Assists with nutrition, care and transit needs. 336-349-2343  Cancer Center Support Programs:   > Cancer Support Group  2nd Tuesday of the month 1pm-2pm, Journey Room   > Creative Journey  3rd Tuesday of the month 1130am-1pm, Journey Room    

## 2017-09-23 NOTE — Progress Notes (Signed)
Feraheme given today per orders. Patient tolerated it well without problems. Vitals stable and discharged home from clinic ambulatory. Follow up as scheduled.  

## 2017-09-26 ENCOUNTER — Ambulatory Visit (HOSPITAL_COMMUNITY): Payer: Medicare Other

## 2017-09-30 ENCOUNTER — Encounter (HOSPITAL_COMMUNITY): Payer: Self-pay

## 2017-09-30 ENCOUNTER — Inpatient Hospital Stay (HOSPITAL_COMMUNITY): Payer: Medicare Other | Attending: Internal Medicine

## 2017-09-30 VITALS — BP 129/68 | HR 81 | Temp 98.4°F | Resp 18

## 2017-09-30 DIAGNOSIS — D509 Iron deficiency anemia, unspecified: Secondary | ICD-10-CM | POA: Diagnosis not present

## 2017-09-30 DIAGNOSIS — D500 Iron deficiency anemia secondary to blood loss (chronic): Secondary | ICD-10-CM

## 2017-09-30 MED ORDER — FERUMOXYTOL INJECTION 510 MG/17 ML
510.0000 mg | Freq: Once | INTRAVENOUS | Status: AC
  Administered 2017-09-30: 510 mg via INTRAVENOUS
  Filled 2017-09-30: qty 17

## 2017-09-30 MED ORDER — SODIUM CHLORIDE 0.9 % IV SOLN
Freq: Once | INTRAVENOUS | Status: AC
  Administered 2017-09-30: 13:00:00 via INTRAVENOUS

## 2017-09-30 NOTE — Progress Notes (Signed)
Bruce Duarte tolerated Feraheme infusion well without complaints or incident. VSS upon discharge. Pt discharged self ambulatory in satisfactory condition 

## 2017-09-30 NOTE — Patient Instructions (Signed)
[facility] Discharge Instructions  Received Feraheme infusion today. Follow-up as scheduled. Call clinic for any questions or concerns   Thank you for choosing [facility] at [facility] to provide your oncology and hematology care.  To afford each patient quality time with our provider, please arrive at least 15 minutes before your scheduled appointment time.   If you have a lab appointment with the Cancer Center please come in thru the  Main Entrance and check in at the main information desk  You need to re-schedule your appointment should you arrive 10 or more minutes late.  We strive to give you quality time with our providers, and arriving late affects you and other patients whose appointments are after yours.  Also, if you no show three or more times for appointments you may be dismissed from the clinic at the providers discretion.     Again, thank you for choosing [facility].  Our hope is that these requests will decrease the amount of time that you wait before being seen by our physicians.       _____________________________________________________________  Should you have questions after your visit to [facility], please contact our office at [phone] between the hours of 8:30 a.m. and 4:30 p.m.  Voicemails left after 4:30 p.m. will not be returned until the following business day.  For prescription refill requests, have your pharmacy contact our office.       Resources For Cancer Patients and their Caregivers ? American Cancer Society: Can assist with transportation, wigs, general needs, runs Look Good Feel Better.        1-888-227-6333 ? Cancer Care: Provides financial assistance, online support groups, medication/co-pay assistance.  1-800-813-HOPE (4673) ? [facility] Assists [place] cancer patients and their families through emotional , educational and  financial support.  336-427-4357 ? [place] DSS Where to apply for food stamps, Medicaid and utility assistance. 336-342-1394 ? RCATS: Transportation to medical appointments. 336-347-2287 ? Social Security Administration: May apply for disability if have a Stage IV cancer. 336-342-7796 1-800-772-1213 ? [place] Aging, Disability and Transit Services: Assists with nutrition, care and transit needs. 336-349-2343  Cancer Center Support Programs:   > Cancer Support Group  2nd Tuesday of the month 1pm-2pm, Journey Room   > Creative Journey  3rd Tuesday of the month 1130am-1pm, Journey Room    

## 2017-11-11 DIAGNOSIS — C61 Malignant neoplasm of prostate: Secondary | ICD-10-CM | POA: Diagnosis not present

## 2017-11-16 ENCOUNTER — Ambulatory Visit (INDEPENDENT_AMBULATORY_CARE_PROVIDER_SITE_OTHER): Payer: Medicare Other | Admitting: Urology

## 2017-11-16 DIAGNOSIS — N401 Enlarged prostate with lower urinary tract symptoms: Secondary | ICD-10-CM | POA: Diagnosis not present

## 2017-11-16 DIAGNOSIS — N5201 Erectile dysfunction due to arterial insufficiency: Secondary | ICD-10-CM

## 2017-11-16 DIAGNOSIS — C61 Malignant neoplasm of prostate: Secondary | ICD-10-CM | POA: Diagnosis not present

## 2017-12-05 DIAGNOSIS — Z0000 Encounter for general adult medical examination without abnormal findings: Secondary | ICD-10-CM | POA: Diagnosis not present

## 2017-12-12 ENCOUNTER — Inpatient Hospital Stay (HOSPITAL_COMMUNITY): Payer: Medicare Other | Attending: Hematology

## 2017-12-12 DIAGNOSIS — D509 Iron deficiency anemia, unspecified: Secondary | ICD-10-CM | POA: Diagnosis not present

## 2017-12-12 DIAGNOSIS — Z8546 Personal history of malignant neoplasm of prostate: Secondary | ICD-10-CM | POA: Diagnosis not present

## 2017-12-12 DIAGNOSIS — D508 Other iron deficiency anemias: Secondary | ICD-10-CM

## 2017-12-12 LAB — CBC
HEMATOCRIT: 38.1 % — AB (ref 39.0–52.0)
Hemoglobin: 11.8 g/dL — ABNORMAL LOW (ref 13.0–17.0)
MCH: 26.3 pg (ref 26.0–34.0)
MCHC: 31 g/dL (ref 30.0–36.0)
MCV: 85 fL (ref 78.0–100.0)
Platelets: 235 10*3/uL (ref 150–400)
RBC: 4.48 MIL/uL (ref 4.22–5.81)
RDW: 19.7 % — AB (ref 11.5–15.5)
WBC: 3.4 10*3/uL — AB (ref 4.0–10.5)

## 2017-12-12 LAB — IRON AND TIBC
Iron: 42 ug/dL — ABNORMAL LOW (ref 45–182)
SATURATION RATIOS: 12 % — AB (ref 17.9–39.5)
TIBC: 354 ug/dL (ref 250–450)
UIBC: 312 ug/dL

## 2017-12-12 LAB — FOLATE: Folate: 24.6 ng/mL (ref >5.9)

## 2017-12-12 LAB — FERRITIN: FERRITIN: 32 ng/mL (ref 24–336)

## 2017-12-12 LAB — VITAMIN B12: VITAMIN B 12: 461 pg/mL (ref 180–914)

## 2017-12-19 ENCOUNTER — Inpatient Hospital Stay (HOSPITAL_BASED_OUTPATIENT_CLINIC_OR_DEPARTMENT_OTHER): Payer: Medicare Other | Admitting: Hematology

## 2017-12-19 ENCOUNTER — Encounter (HOSPITAL_COMMUNITY): Payer: Self-pay | Admitting: Hematology

## 2017-12-19 ENCOUNTER — Other Ambulatory Visit: Payer: Self-pay

## 2017-12-19 DIAGNOSIS — D509 Iron deficiency anemia, unspecified: Secondary | ICD-10-CM

## 2017-12-19 DIAGNOSIS — Z8546 Personal history of malignant neoplasm of prostate: Secondary | ICD-10-CM | POA: Diagnosis not present

## 2017-12-19 DIAGNOSIS — D508 Other iron deficiency anemias: Secondary | ICD-10-CM

## 2017-12-19 NOTE — Assessment & Plan Note (Signed)
1.  Iron deficiency anemia: - Previous history of gastric erosions, diverticulosis and polyps, on intermittent Feraheme infusions, last infusion on 06/14/2017 for a ferritin of 28. - Last Feraheme infusion on 09/23/2017 and 09/30/2017.  Patient feels better in terms of energy.  Hemoglobin improved back to 11.8.  Ferritin however was still low at 32 with minimal improvement from 10 prior to 2 infusions.  I think he is continuing to lose blood.  I have recommended 2 more infusions of Feraheme 1 week apart.  We will reevaluate him in 4 months for follow-up.  We will also repeat labs prior to next visit.  2.  Prostate cancer: He underwent seed implants 3 years ago.  He follows up with his urologist.  Reportedly his PSA was undetectable.

## 2017-12-19 NOTE — Progress Notes (Signed)
[facility], [place] [address]   CLINIC:  Medical Oncology/Hematology  PCP:  [name], [address] [place] [address] [phone]   REASON FOR VISIT:  Follow-up for iron deficiency anemia  CURRENT THERAPY: Intermittent feraheme infusions   INTERVAL HISTORY:  Bruce Duarte 82 y.o. male returns for routine follow-up for his iron deficiency anemia. He is here today and did well after his last infusion. He states it did increase in energy some but made him feel better overall. His overall energy and appetite is at 100% now. He denies any signs of bleeding. Denies any easy bruising. Denies any nausea, vomiting, or diarrhea. He states he is willing to get more infusions to make him feel better.     REVIEW OF SYSTEMS:  Review of Systems  All other systems reviewed and are negative.    PAST MEDICAL/SURGICAL HISTORY:  Past Medical History:  Diagnosis Date  . Arthritis   . At risk for sleep apnea    STOP-BANG= 5   SENT TO PCP 01-22-2014  . Borderline type 2 diabetes mellitus   . Hyperlipidemia   . Hypertension   . IDA (iron deficiency anemia) 07/27/2016  . Malignant neoplasm of prostate ([place]) 12/04/2013  . Prostate cancer ([place]) DX  2013   Gleason 3+3=6, vol 42.34 cc  . Wears glasses    Past Surgical History:  Procedure Laterality Date  . APPENDECTOMY  age 41  . COLONOSCOPY N/A 05/05/2016   Procedure: COLONOSCOPY;  Surgeon: Daneil Dolin, MD;  [facility];  Service: Endoscopy;  Laterality: N/A;  815  . COLONOSCOPY  01/2015   Dr. Carol Ada: hyperplastic polyps. H/O tubulovillous adenoma as well.   . COLONOSCOPY W/ BIOPSIES  2009   non-cancerous  . ESOPHAGOGASTRODUODENOSCOPY N/A 05/05/2016   Procedure: ESOPHAGOGASTRODUODENOSCOPY (EGD);  Surgeon: Daneil Dolin, MD;  [facility];  Service: Endoscopy;  Laterality: N/A;  . GIVENS CAPSULE STUDY N/A 09/06/2016   Procedure: GIVENS CAPSULE STUDY;  Surgeon:  Daneil Dolin, MD;  [facility];  Service: Endoscopy;  Laterality: N/A;  7:30am (patient will arrive at 7:00am)  . PROSTATE BIOPSY  11/05/13   adenocarcinoma, gleason 6  . RADIOACTIVE SEED IMPLANT N/A 01/29/2014   Procedure: RADIOACTIVE SEED IMPLANT;  Surgeon: Arvil Persons, MD;  Location: [facility];  Service: Urology;  Laterality: N/A;     seeds implanted no seeds found in bladder     SOCIAL HISTORY:  Social History   Socioeconomic History  . Marital status: Married    Spouse name: Not on file  . Number of children: Not on file  . Years of education: Not on file  . Highest education level: Not on file  Occupational History  . Not on file  Social Needs  . Financial resource strain: Not on file  . Food insecurity:    Worry: Not on file    Inability: Not on file  . Transportation needs:    Medical: Not on file    Non-medical: Not on file  Tobacco Use  . Smoking status: Former Smoker    Packs/day: 1.00    Years: 25.00    Pack years: 25.00    Types: Cigarettes, Pipe, Cigars    Last attempt to quit: 12/04/1973    Years since quitting: 44.0  . Smokeless tobacco: Never Used  Substance and Sexual Activity  . Alcohol use: Yes    Comment:  occasionally brandy  . Drug use: No  . Sexual activity: Not on file  Lifestyle  . Physical activity:    Days per week: Not on file    Minutes per session: Not on file  . Stress: Not on file  Relationships  . Social connections:    Talks on phone: Not on file    Gets together: Not on file    Attends religious service: Not on file    Active member of club or organization: Not on file    Attends meetings of clubs or organizations: Not on file    Relationship status: Not on file  . Intimate partner violence:    Fear of current or ex partner: Not on file    Emotionally abused: Not on file    Physically abused: Not on file    Forced sexual activity: Not on file  Other Topics Concern  . Not on file  Social History  Narrative  . Not on file    FAMILY HISTORY:  Family History  Problem Relation Age of Onset  . Diabetes Mother   . Stroke Father   . Cancer Cousin        prostate  . Colon cancer Neg Hx     CURRENT MEDICATIONS:  Outpatient Encounter Medications as of 12/19/2017  Medication Sig  . aspirin EC 81 MG tablet Take 81 mg by mouth daily.  Marland Kitchen docusate sodium (COLACE) 100 MG capsule Take 200 mg by mouth daily.  Marland Kitchen losartan (COZAAR) 100 MG tablet Take 100 mg by mouth daily.  Marland Kitchen losartan-hydrochlorothiazide (HYZAAR) 100-25 MG per tablet Take 1 tablet by mouth every morning.   . metFORMIN (GLUCOPHAGE) 500 MG tablet Take 2 tablets by mouth daily.  . Misc Natural Products (GINSENG COMPLEX PO) Take 1 capsule by mouth daily.   . Multiple Vitamins-Minerals (MULTIVITAMIN GUMMIES MENS PO) Take 1 tablet by mouth daily.  . sildenafil (REVATIO) 20 MG tablet TAKE THREE TABLETS BY MOUTH AS NEEDED  . tamsulosin (FLOMAX) 0.4 MG CAPS capsule Take 0.4 mg by mouth daily.  . [DISCONTINUED] potassium chloride SA (K-DUR,KLOR-CON) 20 MEQ tablet Take 1 tablet (20 mEq total) by mouth 2 (two) times daily.   No facility-administered encounter medications on file as of 12/19/2017.     ALLERGIES:  No Known Allergies   PHYSICAL EXAM:  ECOG Performance status: 0  Vitals:   12/19/17 1451  Pulse: (!) 103  Resp: 18  Temp: 98.3 F (36.8 C)  SpO2: 97%   Filed Weights   12/19/17 1451  Weight: 216 lb (98 kg)    Physical Exam   LABORATORY DATA:  I have reviewed the labs as listed.  CBC    Component Value Date/Time   WBC 3.4 (L) 12/12/2017 1152   RBC 4.48 12/12/2017 1152   HGB 11.8 (L) 12/12/2017 1152   HCT 38.1 (L) 12/12/2017 1152   PLT 235 12/12/2017 1152   MCV 85.0 12/12/2017 1152   MCH 26.3 12/12/2017 1152   MCHC 31.0 12/12/2017 1152   RDW 19.7 (H) 12/12/2017 1152   LYMPHSABS 1.2 09/05/2017 1316   MONOABS 0.4 09/05/2017 1316   EOSABS 0.2 09/05/2017 1316   BASOSABS 0.1 09/05/2017 1316   CMP Latest  Ref Rng & Units 09/05/2017 06/03/2017 02/02/2017  Glucose 65 - 99 mg/dL 99 171(H) 126(H)  BUN 6 - 20 mg/dL 15 16 16   Creatinine 0.61 - 1.24 mg/dL 1.17 1.15 1.12  Sodium 135 - 145 mmol/L 139 137 137  Potassium 3.5 - 5.1  mmol/L 3.3(L) 3.3(L) 3.2(L)  Chloride 101 - 111 mmol/L 102 99(L) 101  CO2 22 - 32 mmol/L 26 26 27   Calcium 8.9 - 10.3 mg/dL 9.2 8.8(L) 9.5  Total Protein 6.5 - 8.1 g/dL 7.3 6.9 -  Total Bilirubin 0.3 - 1.2 mg/dL 0.5 0.5 -  Alkaline Phos 38 - 126 U/L 48 44 -  AST 15 - 41 U/L 33 38 -  ALT 17 - 63 U/L 30 29 -       ASSESSMENT & PLAN:   IDA (iron deficiency anemia) 1.  Iron deficiency anemia: - Previous history of gastric erosions, diverticulosis and polyps, on intermittent Feraheme infusions, last infusion on 06/14/2017 for a ferritin of 28. - Last Feraheme infusion on 09/23/2017 and 09/30/2017.  Patient feels better in terms of energy.  Hemoglobin improved back to 11.8.  Ferritin however was still low at 32 with minimal improvement from 10 prior to 2 infusions.  I think he is continuing to lose blood.  I have recommended 2 more infusions of Feraheme 1 week apart.  We will reevaluate him in 4 months for follow-up.  We will also repeat labs prior to next visit.  2.  Prostate cancer: He underwent seed implants 3 years ago.  He follows up with his urologist.  Reportedly his PSA was undetectable.      Orders placed this encounter:  Orders Placed This Encounter  Procedures  . CBC with Differential/Platelet  . Comprehensive metabolic panel  . Ferritin  . Iron and TIBC      Derek Jack, MD [place] [phone]

## 2017-12-26 ENCOUNTER — Inpatient Hospital Stay (HOSPITAL_COMMUNITY): Payer: Medicare Other | Attending: Internal Medicine

## 2017-12-26 ENCOUNTER — Encounter (HOSPITAL_COMMUNITY): Payer: Self-pay

## 2017-12-26 VITALS — BP 120/60 | HR 77 | Temp 98.1°F | Resp 18

## 2017-12-26 DIAGNOSIS — D509 Iron deficiency anemia, unspecified: Secondary | ICD-10-CM | POA: Insufficient documentation

## 2017-12-26 DIAGNOSIS — D500 Iron deficiency anemia secondary to blood loss (chronic): Secondary | ICD-10-CM

## 2017-12-26 MED ORDER — SODIUM CHLORIDE 0.9 % IV SOLN
INTRAVENOUS | Status: DC
  Administered 2017-12-26: 13:00:00 via INTRAVENOUS

## 2017-12-26 MED ORDER — FERUMOXYTOL INJECTION 510 MG/17 ML
510.0000 mg | Freq: Once | INTRAVENOUS | Status: AC
  Administered 2017-12-26: 510 mg via INTRAVENOUS
  Filled 2017-12-26: qty 17

## 2017-12-26 MED ORDER — SODIUM CHLORIDE 0.9% FLUSH
10.0000 mL | Freq: Once | INTRAVENOUS | Status: AC
  Administered 2017-12-26: 10 mL via INTRAVENOUS

## 2017-12-26 NOTE — Patient Instructions (Signed)
[place] at [facility]  Discharge Instructions:  You received faraheme today.  Ferumoxytol injection What is this medicine? FERUMOXYTOL is an iron complex. Iron is used to make healthy red blood cells, which carry oxygen and nutrients throughout the body. This medicine is used to treat iron deficiency anemia in people with chronic kidney disease. This medicine may be used for other purposes; ask your health care provider or pharmacist if you have questions. COMMON BRAND NAME(S): Feraheme What should I tell my health care provider before I take this medicine? They need to know if you have any of these conditions: -anemia not caused by low iron levels -high levels of iron in the blood -magnetic resonance imaging (MRI) test scheduled -an unusual or allergic reaction to iron, other medicines, foods, dyes, or preservatives -pregnant or trying to get pregnant -breast-feeding How should I use this medicine? This medicine is for injection into a vein. It is given by a health care professional in a hospital or clinic setting. Talk to your pediatrician regarding the use of this medicine in children. Special care may be needed. Overdosage: If you think you have taken too much of this medicine contact a poison control center or emergency room at once. NOTE: This medicine is only for you. Do not share this medicine with others. What if I miss a dose? It is important not to miss your dose. Call your doctor or health care professional if you are unable to keep an appointment. What may interact with this medicine? This medicine may interact with the following medications: -other iron products This list may not describe all possible interactions. Give your health care provider a list of all the medicines, herbs, non-prescription drugs, or dietary supplements you use. Also tell them if you smoke, drink alcohol, or use illegal drugs. Some items may interact with your  medicine. What should I watch for while using this medicine? Visit your doctor or healthcare professional regularly. Tell your doctor or healthcare professional if your symptoms do not start to get better or if they get worse. You may need blood work done while you are taking this medicine. You may need to follow a special diet. Talk to your doctor. Foods that contain iron include: whole grains/cereals, dried fruits, beans, or peas, leafy green vegetables, and organ meats (liver, kidney). What side effects may I notice from receiving this medicine? Side effects that you should report to your doctor or health care professional as soon as possible: -allergic reactions like skin rash, itching or hives, swelling of the face, lips, or tongue -breathing problems -changes in blood pressure -feeling faint or lightheaded, falls -fever or chills -flushing, sweating, or hot feelings -swelling of the ankles or feet Side effects that usually do not require medical attention (report to your doctor or health care professional if they continue or are bothersome): -diarrhea -headache -nausea, vomiting -stomach pain This list may not describe all possible side effects. Call your doctor for medical advice about side effects. You may report side effects to FDA at 1-800-FDA-1088. Where should I keep my medicine? This drug is given in a hospital or clinic and will not be stored at home. NOTE: This sheet is a summary. It may not cover all possible information. If you have questions about this medicine, talk to your doctor, pharmacist, or health care provider.  2018 Elsevier/Gold Standard (2015-06-19 12:41:49)  _______________________________________________________________  Thank you for choosing [place] at [facility] to provide your oncology  and hematology care.  To afford each patient quality time with our providers, please arrive at least 15 minutes before your scheduled  appointment.  You need to re-schedule your appointment if you arrive 10 or more minutes late.  We strive to give you quality time with our providers, and arriving late affects you and other patients whose appointments are after yours.  Also, if you no show three or more times for appointments you may be dismissed from the clinic.  Again, thank you for choosing [place] at [place] hope is that these requests will allow you access to exceptional care and in a timely manner. _______________________________________________________________  If you have questions after your visit, please contact our office at [phone] between the hours of 8:30 a.m. and 5:00 p.m. Voicemails left after 4:30 p.m. will not be returned until the following business day. _______________________________________________________________  For prescription refill requests, have your pharmacy contact our office. _______________________________________________________________  Recommendations made by the consultant and any test results will be sent to your referring physician. _______________________________________________________________

## 2017-12-26 NOTE — Progress Notes (Signed)
Patient tolerated IV iron with no complaints voiced.  Peripheral IV site clean and dry with no bruising or swelling noted at site.  Good blood return noted before and after administration of iron.  Band aid applied.  Denied pain at site.  VSS with discharge and left ambulatory with no s/s of distress noted.

## 2018-01-02 ENCOUNTER — Inpatient Hospital Stay (HOSPITAL_COMMUNITY): Payer: Medicare Other | Attending: Internal Medicine

## 2018-01-02 ENCOUNTER — Encounter (HOSPITAL_COMMUNITY): Payer: Self-pay

## 2018-01-02 VITALS — BP 129/52 | HR 75 | Temp 97.7°F | Resp 16

## 2018-01-02 DIAGNOSIS — D500 Iron deficiency anemia secondary to blood loss (chronic): Secondary | ICD-10-CM

## 2018-01-02 DIAGNOSIS — D509 Iron deficiency anemia, unspecified: Secondary | ICD-10-CM | POA: Diagnosis not present

## 2018-01-02 MED ORDER — SODIUM CHLORIDE 0.9 % IV SOLN
510.0000 mg | Freq: Once | INTRAVENOUS | Status: AC
  Administered 2018-01-02: 510 mg via INTRAVENOUS
  Filled 2018-01-02: qty 17

## 2018-01-02 MED ORDER — SODIUM CHLORIDE 0.9 % IV SOLN
INTRAVENOUS | Status: DC
  Administered 2018-01-02: 12:00:00 via INTRAVENOUS

## 2018-01-02 NOTE — Patient Instructions (Signed)
[facility] at [facility] Discharge Instructions  Received Feraheme infusion today. Follow-up as scheduled. Call clinic for any questions or concerns   Thank you for choosing [facility] at [facility] to provide your oncology and hematology care.  To afford each patient quality time with our provider, please arrive at least 15 minutes before your scheduled appointment time.   If you have a lab appointment with the Cancer Center please come in thru the  Main Entrance and check in at the main information desk  You need to re-schedule your appointment should you arrive 10 or more minutes late.  We strive to give you quality time with our providers, and arriving late affects you and other patients whose appointments are after yours.  Also, if you no show three or more times for appointments you may be dismissed from the clinic at the providers discretion.     Again, thank you for choosing [facility].  Our hope is that these requests will decrease the amount of time that you wait before being seen by our physicians.       _____________________________________________________________  Should you have questions after your visit to [facility], please contact our office at [phone] between the hours of 8:00 a.m. and 4:30 p.m.  Voicemails left after 4:00 p.m. will not be returned until the following business day.  For prescription refill requests, have your pharmacy contact our office and allow 72 hours.    Cancer Center Support Programs:   > Cancer Support Group  2nd Tuesday of the month 1pm-2pm, Journey Room   

## 2018-01-02 NOTE — Progress Notes (Signed)
Bruce Duarte tolerated Feraheme infusion well without complaints or incident. VSS upon discharge. Pt discharged self ambulatory in satisfactory condition

## 2018-02-02 DIAGNOSIS — Z6833 Body mass index (BMI) 33.0-33.9, adult: Secondary | ICD-10-CM | POA: Diagnosis not present

## 2018-02-02 DIAGNOSIS — E782 Mixed hyperlipidemia: Secondary | ICD-10-CM | POA: Diagnosis not present

## 2018-02-02 DIAGNOSIS — E1169 Type 2 diabetes mellitus with other specified complication: Secondary | ICD-10-CM | POA: Diagnosis not present

## 2018-02-02 DIAGNOSIS — R799 Abnormal finding of blood chemistry, unspecified: Secondary | ICD-10-CM | POA: Diagnosis not present

## 2018-02-02 DIAGNOSIS — I10 Essential (primary) hypertension: Secondary | ICD-10-CM | POA: Diagnosis not present

## 2018-02-02 DIAGNOSIS — R21 Rash and other nonspecific skin eruption: Secondary | ICD-10-CM | POA: Diagnosis not present

## 2018-02-02 DIAGNOSIS — D500 Iron deficiency anemia secondary to blood loss (chronic): Secondary | ICD-10-CM | POA: Diagnosis not present

## 2018-03-16 DIAGNOSIS — H40023 Open angle with borderline findings, high risk, bilateral: Secondary | ICD-10-CM | POA: Diagnosis not present

## 2018-04-13 DIAGNOSIS — Z23 Encounter for immunization: Secondary | ICD-10-CM | POA: Diagnosis not present

## 2018-04-17 ENCOUNTER — Inpatient Hospital Stay (HOSPITAL_COMMUNITY): Payer: Medicare Other | Attending: Hematology

## 2018-04-17 DIAGNOSIS — H40023 Open angle with borderline findings, high risk, bilateral: Secondary | ICD-10-CM | POA: Diagnosis not present

## 2018-04-17 DIAGNOSIS — D509 Iron deficiency anemia, unspecified: Secondary | ICD-10-CM | POA: Diagnosis not present

## 2018-04-17 DIAGNOSIS — D508 Other iron deficiency anemias: Secondary | ICD-10-CM

## 2018-04-17 LAB — IRON AND TIBC
IRON: 40 ug/dL — AB (ref 45–182)
Saturation Ratios: 11 % — ABNORMAL LOW (ref 17.9–39.5)
TIBC: 348 ug/dL (ref 250–450)
UIBC: 308 ug/dL

## 2018-04-17 LAB — COMPREHENSIVE METABOLIC PANEL
ALK PHOS: 49 U/L (ref 38–126)
ALT: 25 U/L (ref 0–44)
ANION GAP: 8 (ref 5–15)
AST: 28 U/L (ref 15–41)
Albumin: 4.2 g/dL (ref 3.5–5.0)
BUN: 13 mg/dL (ref 8–23)
CALCIUM: 8.6 mg/dL — AB (ref 8.9–10.3)
CO2: 23 mmol/L (ref 22–32)
CREATININE: 0.91 mg/dL (ref 0.61–1.24)
Chloride: 106 mmol/L (ref 98–111)
Glucose, Bld: 100 mg/dL — ABNORMAL HIGH (ref 70–99)
Potassium: 3.8 mmol/L (ref 3.5–5.1)
Sodium: 137 mmol/L (ref 135–145)
TOTAL PROTEIN: 7.3 g/dL (ref 6.5–8.1)
Total Bilirubin: 0.5 mg/dL (ref 0.3–1.2)

## 2018-04-17 LAB — CBC WITH DIFFERENTIAL/PLATELET
ABS IMMATURE GRANULOCYTES: 0 10*3/uL (ref 0.00–0.07)
BASOS PCT: 1 %
Basophils Absolute: 0 10*3/uL (ref 0.0–0.1)
EOS PCT: 3 %
Eosinophils Absolute: 0.1 10*3/uL (ref 0.0–0.5)
HCT: 39.9 % (ref 39.0–52.0)
HEMOGLOBIN: 12.3 g/dL — AB (ref 13.0–17.0)
Immature Granulocytes: 0 %
Lymphocytes Relative: 32 %
Lymphs Abs: 1 10*3/uL (ref 0.7–4.0)
MCH: 26.9 pg (ref 26.0–34.0)
MCHC: 30.8 g/dL (ref 30.0–36.0)
MCV: 87.1 fL (ref 80.0–100.0)
MONO ABS: 0.3 10*3/uL (ref 0.1–1.0)
MONOS PCT: 10 %
NEUTROS ABS: 1.7 10*3/uL (ref 1.7–7.7)
Neutrophils Relative %: 54 %
Platelets: 240 10*3/uL (ref 150–400)
RBC: 4.58 MIL/uL (ref 4.22–5.81)
RDW: 17.7 % — ABNORMAL HIGH (ref 11.5–15.5)
WBC: 3 10*3/uL — AB (ref 4.0–10.5)
nRBC: 0 % (ref 0.0–0.2)

## 2018-04-17 LAB — FERRITIN: FERRITIN: 34 ng/mL (ref 24–336)

## 2018-04-24 ENCOUNTER — Other Ambulatory Visit: Payer: Self-pay

## 2018-04-24 ENCOUNTER — Encounter (HOSPITAL_COMMUNITY): Payer: Self-pay | Admitting: Hematology

## 2018-04-24 ENCOUNTER — Inpatient Hospital Stay (HOSPITAL_COMMUNITY): Payer: Medicare Other | Attending: Hematology | Admitting: Hematology

## 2018-04-24 VITALS — BP 145/57 | HR 98 | Temp 98.1°F | Resp 18 | Wt 223.1 lb

## 2018-04-24 DIAGNOSIS — Z8546 Personal history of malignant neoplasm of prostate: Secondary | ICD-10-CM | POA: Diagnosis not present

## 2018-04-24 DIAGNOSIS — D509 Iron deficiency anemia, unspecified: Secondary | ICD-10-CM | POA: Insufficient documentation

## 2018-04-24 DIAGNOSIS — D500 Iron deficiency anemia secondary to blood loss (chronic): Secondary | ICD-10-CM

## 2018-04-24 NOTE — Assessment & Plan Note (Signed)
1.  Iron deficiency anemia: - Previous history of gastric erosions, diverticulosis and polyps, on intermittent Feraheme infusions, last infusion on 06/14/2017 for a ferritin of 28. - Feraheme infusion was on 12/26/2017 and 01/02/2018. - We reviewed his labs today.  Hemoglobin is 12.3.  Ferritin is 34 and percent saturation was 11.  The have not improved after his last infusions of iron.  Hence I recommended 2 more infusions of Feraheme. -He is continuing to lose blood.  He will likely need maintenance infusions as long as he is losing it. -I will see him back in 4 months for follow-up.  2.  Prostate cancer: He underwent seed implants 3 years ago.  He follows up with his urologist.  Reportedly his PSA was undetectable.

## 2018-04-24 NOTE — Patient Instructions (Signed)
[facility] at [facility] Discharge Instructions Follow up in 4 months with labs    Thank you for choosing [facility] at [facility] to provide your oncology and hematology care.  To afford each patient quality time with our provider, please arrive at least 15 minutes before your scheduled appointment time.   If you have a lab appointment with the Cancer Center please come in thru the  Main Entrance and check in at the main information desk  You need to re-schedule your appointment should you arrive 10 or more minutes late.  We strive to give you quality time with our providers, and arriving late affects you and other patients whose appointments are after yours.  Also, if you no show three or more times for appointments you may be dismissed from the clinic at the providers discretion.     Again, thank you for choosing [facility].  Our hope is that these requests will decrease the amount of time that you wait before being seen by our physicians.       _____________________________________________________________  Should you have questions after your visit to [facility], please contact our office at [phone] between the hours of 8:00 a.m. and 4:30 p.m.  Voicemails left after 4:00 p.m. will not be returned until the following business day.  For prescription refill requests, have your pharmacy contact our office and allow 72 hours.    Cancer Center Support Programs:   > Cancer Support Group  2nd Tuesday of the month 1pm-2pm, Journey Room    

## 2018-04-24 NOTE — Progress Notes (Signed)
Bruce Duarte, [place] [address]   CLINIC:  Medical Oncology/Hematology  PCP:  [name], [address] [place] [address] [phone]   REASON FOR VISIT: Follow-up for iron deficiency anemia  CURRENT THERAPY: intermittent iron infusions   INTERVAL HISTORY:  Bruce Duarte 82 y.o. male returns for routine follow-up for iron deficiency anemia. He is doing well with no complaints at this time. He denies any bleeding, easy bruising, or blood in his stool. Denies any new pains. Denies any GI issues. He reports his appetite and energy level at 100% and has no problem maintaining his weight. He tries to remain active at home.     REVIEW OF SYSTEMS:  Review of Systems  All other systems reviewed and are negative.    PAST MEDICAL/SURGICAL HISTORY:  Past Medical History:  Diagnosis Date  . Arthritis   . At risk for sleep apnea    STOP-BANG= 5   SENT TO PCP 01-22-2014  . Borderline type 2 diabetes mellitus   . Hyperlipidemia   . Hypertension   . IDA (iron deficiency anemia) 07/27/2016  . Malignant neoplasm of prostate ([place]) 12/04/2013  . Prostate cancer ([place]) DX  2013   Gleason 3+3=6, vol 42.34 cc  . Wears glasses    Past Surgical History:  Procedure Laterality Date  . APPENDECTOMY  age 7  . COLONOSCOPY N/A 05/05/2016   Procedure: COLONOSCOPY;  Surgeon: Daneil Dolin, MD;  [facility];  Service: Endoscopy;  Laterality: N/A;  815  . COLONOSCOPY  01/2015   Dr. Carol Ada: hyperplastic polyps. H/O tubulovillous adenoma as well.   . COLONOSCOPY W/ BIOPSIES  2009   non-cancerous  . ESOPHAGOGASTRODUODENOSCOPY N/A 05/05/2016   Procedure: ESOPHAGOGASTRODUODENOSCOPY (EGD);  Surgeon: Daneil Dolin, MD;  [facility];  Service: Endoscopy;  Laterality: N/A;  . GIVENS CAPSULE STUDY N/A 09/06/2016   Procedure: GIVENS CAPSULE STUDY;  Surgeon: Daneil Dolin, MD;  [facility];  Service: Endoscopy;   Laterality: N/A;  7:30am (patient will arrive at 7:00am)  . PROSTATE BIOPSY  11/05/13   adenocarcinoma, gleason 6  . RADIOACTIVE SEED IMPLANT N/A 01/29/2014   Procedure: RADIOACTIVE SEED IMPLANT;  Surgeon: Arvil Persons, MD;  Location: [facility];  Service: Urology;  Laterality: N/A;     seeds implanted no seeds found in bladder     SOCIAL HISTORY:  Social History   Socioeconomic History  . Marital status: Married    Spouse name: Not on file  . Number of children: Not on file  . Years of education: Not on file  . Highest education level: Not on file  Occupational History  . Not on file  Social Needs  . Financial resource strain: Not on file  . Food insecurity:    Worry: Not on file    Inability: Not on file  . Transportation needs:    Medical: Not on file    Non-medical: Not on file  Tobacco Use  . Smoking status: Former Smoker    Packs/day: 1.00    Years: 25.00    Pack years: 25.00    Types: Cigarettes, Pipe, Cigars    Last attempt to quit: 12/04/1973    Years since quitting: 44.4  . Smokeless tobacco: Never Used  Substance and Sexual Activity  . Alcohol use: Yes    Comment: occasionally brandy  . Drug use: No  . Sexual activity: Not on file  Lifestyle  . Physical activity:    Days per week: Not on file    Minutes per session: Not on file  . Stress: Not on file  Relationships  . Social connections:    Talks on phone: Not on file    Gets together: Not on file    Attends religious service: Not on file    Active member of club or organization: Not on file    Attends meetings of clubs or organizations: Not on file    Relationship status: Not on file  . Intimate partner violence:    Fear of current or ex partner: Not on file    Emotionally abused: Not on file    Physically abused: Not on file    Forced sexual activity: Not on file  Other Topics Concern  . Not on file  Social History Narrative  . Not on file    FAMILY HISTORY:  Family History    Problem Relation Age of Onset  . Diabetes Mother   . Stroke Father   . Cancer Cousin        prostate  . Colon cancer Neg Hx     CURRENT MEDICATIONS:  Outpatient Encounter Medications as of 04/24/2018  Medication Sig  . aspirin EC 81 MG tablet Take 81 mg by mouth daily.  Marland Kitchen docusate sodium (COLACE) 100 MG capsule Take 200 mg by mouth daily.  Marland Kitchen losartan (COZAAR) 100 MG tablet Take 100 mg by mouth daily.  Marland Kitchen LUMIGAN 0.01 % SOLN INSTILL 1 DROP INTO EACH EYE AT BEDTIME  . metFORMIN (GLUCOPHAGE) 500 MG tablet Take 2 tablets by mouth daily.  . Misc Natural Products (GINSENG COMPLEX PO) Take 1 capsule by mouth daily.   . Multiple Vitamins-Minerals (MULTIVITAMIN GUMMIES MENS PO) Take 1 tablet by mouth daily.  . sildenafil (REVATIO) 20 MG tablet TAKE THREE TABLETS BY MOUTH AS NEEDED  . tamsulosin (FLOMAX) 0.4 MG CAPS capsule Take 0.4 mg by mouth daily.  . [DISCONTINUED] losartan-hydrochlorothiazide (HYZAAR) 100-25 MG per tablet Take 1 tablet by mouth every morning.    No facility-administered encounter medications on file as of 04/24/2018.     ALLERGIES:  No Known Allergies   PHYSICAL EXAM:  ECOG Performance status: 1  Vitals:   04/24/18 0846  BP: (!) 145/57  Pulse: 98  Resp: 18  Temp: 98.1 F (36.7 C)  SpO2: 99%   Filed Weights   04/24/18 0846  Weight: 223 lb 1.6 oz (101.2 kg)    Physical Exam  Constitutional: He is oriented to person, place, and time. He appears well-developed and well-nourished.  Musculoskeletal: Normal range of motion.  Neurological: He is alert and oriented to person, place, and time.  Skin: Skin is warm and dry.  Psychiatric: He has a normal mood and affect. His behavior is normal. Judgment and thought content normal.     LABORATORY DATA:  I have reviewed the labs as listed.  CBC    Component Value Date/Time   WBC 3.0 (L) 04/17/2018 1105   RBC 4.58 04/17/2018 1105   HGB 12.3 (L) 04/17/2018 1105   HCT 39.9 04/17/2018 1105   PLT 240  04/17/2018 1105   MCV 87.1 04/17/2018 1105   MCH 26.9 04/17/2018 1105   MCHC 30.8 04/17/2018 1105   RDW 17.7 (H) 04/17/2018 1105   LYMPHSABS 1.0 04/17/2018 1105   MONOABS 0.3 04/17/2018 1105   EOSABS 0.1 04/17/2018 1105   BASOSABS 0.0 04/17/2018 1105   CMP Latest Ref Rng & Units 04/17/2018  09/05/2017 06/03/2017  Glucose 70 - 99 mg/dL 100(H) 99 171(H)  BUN 8 - 23 mg/dL 13 15 16   Creatinine 0.61 - 1.24 mg/dL 0.91 1.17 1.15  Sodium 135 - 145 mmol/L 137 139 137  Potassium 3.5 - 5.1 mmol/L 3.8 3.3(L) 3.3(L)  Chloride 98 - 111 mmol/L 106 102 99(L)  CO2 22 - 32 mmol/L 23 26 26   Calcium 8.9 - 10.3 mg/dL 8.6(L) 9.2 8.8(L)  Total Protein 6.5 - 8.1 g/dL 7.3 7.3 6.9  Total Bilirubin 0.3 - 1.2 mg/dL 0.5 0.5 0.5  Alkaline Phos 38 - 126 U/L 49 48 44  AST 15 - 41 U/L 28 33 38  ALT 0 - 44 U/L 25 30 29          ASSESSMENT & PLAN:   IDA (iron deficiency anemia) 1.  Iron deficiency anemia: - Previous history of gastric erosions, diverticulosis and polyps, on intermittent Feraheme infusions, last infusion on 06/14/2017 for a ferritin of 28. - Feraheme infusion was on 12/26/2017 and 01/02/2018. - We reviewed his labs today.  Hemoglobin is 12.3.  Ferritin is 34 and percent saturation was 11.  The have not improved after his last infusions of iron.  Hence I recommended 2 more infusions of Feraheme. -He is continuing to lose blood.  He will likely need maintenance infusions as long as he is losing it. -I will see him back in 4 months for follow-up.  2.  Prostate cancer: He underwent seed implants 3 years ago.  He follows up with his urologist.  Reportedly his PSA was undetectable.      Orders placed this encounter:  Orders Placed This Encounter  Procedures  . CBC with Differential/Platelet  . Comprehensive metabolic panel  . Ferritin  . Iron and TIBC  . Vitamin B12  . Folate      Derek Jack, MD [place] [phone]

## 2018-05-04 DIAGNOSIS — E782 Mixed hyperlipidemia: Secondary | ICD-10-CM | POA: Diagnosis not present

## 2018-05-04 DIAGNOSIS — I10 Essential (primary) hypertension: Secondary | ICD-10-CM | POA: Diagnosis not present

## 2018-05-05 ENCOUNTER — Encounter (HOSPITAL_COMMUNITY): Payer: Self-pay

## 2018-05-05 ENCOUNTER — Inpatient Hospital Stay (HOSPITAL_COMMUNITY): Payer: Medicare Other | Attending: Internal Medicine

## 2018-05-05 VITALS — BP 135/73 | HR 72 | Temp 97.7°F | Resp 18

## 2018-05-05 DIAGNOSIS — D509 Iron deficiency anemia, unspecified: Secondary | ICD-10-CM | POA: Insufficient documentation

## 2018-05-05 DIAGNOSIS — D500 Iron deficiency anemia secondary to blood loss (chronic): Secondary | ICD-10-CM

## 2018-05-05 MED ORDER — SODIUM CHLORIDE 0.9 % IV SOLN
Freq: Once | INTRAVENOUS | Status: AC
  Administered 2018-05-05: 14:00:00 via INTRAVENOUS

## 2018-05-05 MED ORDER — SODIUM CHLORIDE 0.9 % IV SOLN
510.0000 mg | Freq: Once | INTRAVENOUS | Status: AC
  Administered 2018-05-05: 510 mg via INTRAVENOUS
  Filled 2018-05-05: qty 17

## 2018-05-05 MED ORDER — SODIUM CHLORIDE 0.9% FLUSH
10.0000 mL | Freq: Once | INTRAVENOUS | Status: AC | PRN
  Administered 2018-05-05: 10 mL

## 2018-05-05 NOTE — Progress Notes (Signed)
Patient tolerated iron infusion with no complaints voiced.  Good blood return noted before and after administration of iron with peripheral IV.  No bruising or swelling noted at site.  Band aid applied.  VSs with discharge and left ambulatory with no s/s of distress noted.

## 2018-05-05 NOTE — Patient Instructions (Signed)
[facility] at [facility]  Discharge Instructions:   _______________________________________________________________  Thank you for choosing [facility] at [facility] to provide your oncology and hematology care.  To afford each patient quality time with our providers, please arrive at least 15 minutes before your scheduled appointment.  You need to re-schedule your appointment if you arrive 10 or more minutes late.  We strive to give you quality time with our providers, and arriving late affects you and other patients whose appointments are after yours.  Also, if you no show three or more times for appointments you may be dismissed from the clinic.  Again, thank you for choosing [facility] at [facility]. Our hope is that these requests will allow you access to exceptional care and in a timely manner. _______________________________________________________________  If you have questions after your visit, please contact our office at [phone] between the hours of 8:30 a.m. and 5:00 p.m. Voicemails left after 4:30 p.m. will not be returned until the following business day. _______________________________________________________________  For prescription refill requests, have your pharmacy contact our office. _______________________________________________________________  Recommendations made by the consultant and any test results will be sent to your referring physician. _______________________________________________________________ 

## 2018-05-12 ENCOUNTER — Inpatient Hospital Stay (HOSPITAL_COMMUNITY): Payer: Medicare Other

## 2018-05-12 VITALS — BP 143/54 | HR 82 | Temp 98.2°F | Resp 18

## 2018-05-12 DIAGNOSIS — D509 Iron deficiency anemia, unspecified: Secondary | ICD-10-CM | POA: Diagnosis not present

## 2018-05-12 DIAGNOSIS — D500 Iron deficiency anemia secondary to blood loss (chronic): Secondary | ICD-10-CM

## 2018-05-12 MED ORDER — SODIUM CHLORIDE 0.9 % IV SOLN
510.0000 mg | Freq: Once | INTRAVENOUS | Status: AC
  Administered 2018-05-12: 510 mg via INTRAVENOUS
  Filled 2018-05-12: qty 17

## 2018-05-12 MED ORDER — SODIUM CHLORIDE 0.9 % IV SOLN
Freq: Once | INTRAVENOUS | Status: AC
  Administered 2018-05-12: 14:00:00 via INTRAVENOUS

## 2018-05-12 NOTE — Patient Instructions (Signed)
[facility]  Discharge Instructions:   _______________________________________________________________  Thank you for choosing [facility] at [facility] to provide your oncology and hematology care.  To afford each patient quality time with our providers, please arrive at least 15 minutes before your scheduled appointment.  You need to re-schedule your appointment if you arrive 10 or more minutes late.  We strive to give you quality time with our providers, and arriving late affects you and other patients whose appointments are after yours.  Also, if you no show three or more times for appointments you may be dismissed from the clinic.  Again, thank you for choosing [facility] at [facility]. Our hope is that these requests will allow you access to exceptional care and in a timely manner. _______________________________________________________________  If you have questions after your visit, please contact our office at [phone] between the hours of 8:30 a.m. and 5:00 p.m. Voicemails left after 4:30 p.m. will not be returned until the following business day. _______________________________________________________________  For prescription refill requests, have your pharmacy contact our office. _______________________________________________________________  Recommendations made by the consultant and any test results will be sent to your referring physician. _______________________________________________________________ 

## 2018-05-12 NOTE — Progress Notes (Signed)
Patient tolerated iron infusion with no complaints voiced.  Good blood return noted before and after infusion.  No bruising or swelling noted at site.  Band aid applied.  VSs with discharge and left ambulatory with no s/s of distress noted.

## 2018-05-17 DIAGNOSIS — H40023 Open angle with borderline findings, high risk, bilateral: Secondary | ICD-10-CM | POA: Diagnosis not present

## 2018-05-17 DIAGNOSIS — E119 Type 2 diabetes mellitus without complications: Secondary | ICD-10-CM | POA: Diagnosis not present

## 2018-05-17 DIAGNOSIS — H2513 Age-related nuclear cataract, bilateral: Secondary | ICD-10-CM | POA: Diagnosis not present

## 2018-05-17 DIAGNOSIS — H25013 Cortical age-related cataract, bilateral: Secondary | ICD-10-CM | POA: Diagnosis not present

## 2018-06-12 DIAGNOSIS — H401112 Primary open-angle glaucoma, right eye, moderate stage: Secondary | ICD-10-CM | POA: Diagnosis not present

## 2018-07-03 DIAGNOSIS — H401121 Primary open-angle glaucoma, left eye, mild stage: Secondary | ICD-10-CM | POA: Diagnosis not present

## 2018-07-24 DIAGNOSIS — H40051 Ocular hypertension, right eye: Secondary | ICD-10-CM | POA: Diagnosis not present

## 2018-07-24 DIAGNOSIS — H02403 Unspecified ptosis of bilateral eyelids: Secondary | ICD-10-CM | POA: Diagnosis not present

## 2018-07-24 DIAGNOSIS — H401121 Primary open-angle glaucoma, left eye, mild stage: Secondary | ICD-10-CM | POA: Diagnosis not present

## 2018-07-24 DIAGNOSIS — H401112 Primary open-angle glaucoma, right eye, moderate stage: Secondary | ICD-10-CM | POA: Diagnosis not present

## 2018-08-21 ENCOUNTER — Other Ambulatory Visit (HOSPITAL_COMMUNITY): Payer: Medicare Other

## 2018-08-22 ENCOUNTER — Other Ambulatory Visit: Payer: Self-pay

## 2018-08-22 ENCOUNTER — Inpatient Hospital Stay (HOSPITAL_COMMUNITY): Payer: Medicare Other | Attending: Nurse Practitioner

## 2018-08-22 DIAGNOSIS — Z8546 Personal history of malignant neoplasm of prostate: Secondary | ICD-10-CM | POA: Diagnosis not present

## 2018-08-22 DIAGNOSIS — D509 Iron deficiency anemia, unspecified: Secondary | ICD-10-CM | POA: Insufficient documentation

## 2018-08-22 DIAGNOSIS — D500 Iron deficiency anemia secondary to blood loss (chronic): Secondary | ICD-10-CM

## 2018-08-22 LAB — CBC WITH DIFFERENTIAL/PLATELET
ABS IMMATURE GRANULOCYTES: 0.01 10*3/uL (ref 0.00–0.07)
Basophils Absolute: 0 10*3/uL (ref 0.0–0.1)
Basophils Relative: 1 %
Eosinophils Absolute: 0.1 10*3/uL (ref 0.0–0.5)
Eosinophils Relative: 4 %
HEMATOCRIT: 41.8 % (ref 39.0–52.0)
HEMOGLOBIN: 12.9 g/dL — AB (ref 13.0–17.0)
Immature Granulocytes: 0 %
Lymphocytes Relative: 25 %
Lymphs Abs: 0.9 10*3/uL (ref 0.7–4.0)
MCH: 26.8 pg (ref 26.0–34.0)
MCHC: 30.9 g/dL (ref 30.0–36.0)
MCV: 86.7 fL (ref 80.0–100.0)
Monocytes Absolute: 0.3 10*3/uL (ref 0.1–1.0)
Monocytes Relative: 9 %
NEUTROS ABS: 2.2 10*3/uL (ref 1.7–7.7)
Neutrophils Relative %: 61 %
Platelets: 236 10*3/uL (ref 150–400)
RBC: 4.82 MIL/uL (ref 4.22–5.81)
RDW: 17.2 % — ABNORMAL HIGH (ref 11.5–15.5)
WBC: 3.6 10*3/uL — ABNORMAL LOW (ref 4.0–10.5)
nRBC: 0 % (ref 0.0–0.2)

## 2018-08-22 LAB — COMPREHENSIVE METABOLIC PANEL
ALK PHOS: 53 U/L (ref 38–126)
ALT: 23 U/L (ref 0–44)
AST: 28 U/L (ref 15–41)
Albumin: 4.2 g/dL (ref 3.5–5.0)
Anion gap: 10 (ref 5–15)
BILIRUBIN TOTAL: 0.3 mg/dL (ref 0.3–1.2)
BUN: 15 mg/dL (ref 8–23)
CO2: 24 mmol/L (ref 22–32)
Calcium: 8.8 mg/dL — ABNORMAL LOW (ref 8.9–10.3)
Chloride: 107 mmol/L (ref 98–111)
Creatinine, Ser: 0.99 mg/dL (ref 0.61–1.24)
GFR calc Af Amer: >60 mL/min (ref >60)
GFR calc non Af Amer: >60 mL/min (ref >60)
GLUCOSE: 126 mg/dL — AB (ref 70–99)
Potassium: 3.8 mmol/L (ref 3.5–5.1)
Sodium: 141 mmol/L (ref 135–145)
TOTAL PROTEIN: 6.9 g/dL (ref 6.5–8.1)

## 2018-08-22 LAB — IRON AND TIBC
Iron: 61 ug/dL (ref 45–182)
Saturation Ratios: 18 % (ref 17.9–39.5)
TIBC: 348 ug/dL (ref 250–450)
UIBC: 287 ug/dL

## 2018-08-22 LAB — FERRITIN: Ferritin: 35 ng/mL (ref 24–336)

## 2018-08-22 LAB — VITAMIN B12: Vitamin B-12: 334 pg/mL (ref 180–914)

## 2018-08-22 LAB — FOLATE: Folate: 38.2 ng/mL (ref >5.9)

## 2018-08-28 ENCOUNTER — Inpatient Hospital Stay (HOSPITAL_BASED_OUTPATIENT_CLINIC_OR_DEPARTMENT_OTHER): Payer: Medicare Other | Admitting: Hematology

## 2018-08-28 ENCOUNTER — Other Ambulatory Visit: Payer: Self-pay

## 2018-08-28 ENCOUNTER — Encounter (HOSPITAL_COMMUNITY): Payer: Self-pay | Admitting: Hematology

## 2018-08-28 VITALS — BP 141/75 | HR 103 | Temp 97.2°F | Wt 222.3 lb

## 2018-08-28 DIAGNOSIS — Z8546 Personal history of malignant neoplasm of prostate: Secondary | ICD-10-CM | POA: Diagnosis not present

## 2018-08-28 DIAGNOSIS — D509 Iron deficiency anemia, unspecified: Secondary | ICD-10-CM | POA: Diagnosis not present

## 2018-08-28 DIAGNOSIS — D500 Iron deficiency anemia secondary to blood loss (chronic): Secondary | ICD-10-CM

## 2018-08-28 NOTE — Progress Notes (Signed)
Bruce Duarte, [place] [address]   CLINIC:  Medical Oncology/Hematology  PCP:  [name], MD [address] [place] [address] [phone]   REASON FOR VISIT:  Follow-up for iron deficiency anemia  CURRENT THERAPY: intermittent iron infusions    INTERVAL HISTORY:  Bruce Duarte 83 y.o. male returns for routine follow-up. He is here today alone. He states that he did notice some improvement after last Iron infusion. He was educated on social distancing and the importance of staying at home. He denies any black, tarry stools. Denies any nausea, vomiting, or diarrhea. Denies any new pains. Had not noticed any recent bleeding such as epistaxis, hematuria or hematochezia. Denies recent chest pain on exertion, shortness of breath on minimal exertion, pre-syncopal episodes, or palpitations. Denies any numbness or tingling in hands or feet. Denies any recent fevers, infections, or recent hospitalizations. Patient reports appetite at 100% and energy level at 100%.     REVIEW OF SYSTEMS:  Review of Systems  All other systems reviewed and are negative.    PAST MEDICAL/SURGICAL HISTORY:  Past Medical History:  Diagnosis Date  . Arthritis   . At risk for sleep apnea    STOP-BANG= 5   SENT TO PCP 01-22-2014  . Borderline type 2 diabetes mellitus   . Hyperlipidemia   . Hypertension   . IDA (iron deficiency anemia) 07/27/2016  . Malignant neoplasm of prostate ([facility]) 12/04/2013  . Prostate cancer ([place]) DX  2013   Gleason 3+3=6, vol 42.34 cc  . Wears glasses    Past Surgical History:  Procedure Laterality Date  . APPENDECTOMY  age 32  . COLONOSCOPY N/A 05/05/2016   Procedure: COLONOSCOPY;  Surgeon: Daneil Dolin, MD;  [facility];  Service: Endoscopy;  Laterality: N/A;  815  . COLONOSCOPY  01/2015   Dr. Carol Ada: hyperplastic polyps. H/O tubulovillous adenoma as well.   . COLONOSCOPY W/ BIOPSIES  2009   non-cancerous  .  ESOPHAGOGASTRODUODENOSCOPY N/A 05/05/2016   Procedure: ESOPHAGOGASTRODUODENOSCOPY (EGD);  Surgeon: Daneil Dolin, MD;  [facility];  Service: Endoscopy;  Laterality: N/A;  . GIVENS CAPSULE STUDY N/A 09/06/2016   Procedure: GIVENS CAPSULE STUDY;  Surgeon: Daneil Dolin, MD;  [facility];  Service: Endoscopy;  Laterality: N/A;  7:30am (patient will arrive at 7:00am)  . PROSTATE BIOPSY  11/05/13   adenocarcinoma, gleason 6  . RADIOACTIVE SEED IMPLANT N/A 01/29/2014   Procedure: RADIOACTIVE SEED IMPLANT;  Surgeon: Arvil Persons, MD;  Location: [facility];  Service: Urology;  Laterality: N/A;     seeds implanted no seeds found in bladder     SOCIAL HISTORY:  Social History   Socioeconomic History  . Marital status: Married    Spouse name: Not on file  . Number of children: Not on file  . Years of education: Not on file  . Highest education level: Not on file  Occupational History  . Not on file  Social Needs  . Financial resource strain: Not on file  . Food insecurity:    Worry: Not on file    Inability: Not on file  . Transportation needs:    Medical: Not on file    Non-medical: Not on file  Tobacco Use  . Smoking status: Former Smoker    Packs/day: 1.00    Years: 25.00    Pack years: 25.00    Types: Cigarettes, Pipe, Cigars  Last attempt to quit: 12/04/1973    Years since quitting: 44.7  . Smokeless tobacco: Never Used  Substance and Sexual Activity  . Alcohol use: Yes    Comment: occasionally brandy  . Drug use: No  . Sexual activity: Not on file  Lifestyle  . Physical activity:    Days per week: Not on file    Minutes per session: Not on file  . Stress: Not on file  Relationships  . Social connections:    Talks on phone: Not on file    Gets together: Not on file    Attends religious service: Not on file    Active member of club or organization: Not on file    Attends meetings of clubs or organizations: Not on file     Relationship status: Not on file  . Intimate partner violence:    Fear of current or ex partner: Not on file    Emotionally abused: Not on file    Physically abused: Not on file    Forced sexual activity: Not on file  Other Topics Concern  . Not on file  Social History Narrative  . Not on file    FAMILY HISTORY:  Family History  Problem Relation Age of Onset  . Diabetes Mother   . Stroke Father   . Cancer Cousin        prostate  . Colon cancer Neg Hx     CURRENT MEDICATIONS:  Outpatient Encounter Medications as of 08/28/2018  Medication Sig  . aspirin EC 81 MG tablet Take 81 mg by mouth daily.  Marland Kitchen docusate sodium (COLACE) 100 MG capsule Take 200 mg by mouth daily.  . dorzolamide-timolol (COSOPT) 22.3-6.8 MG/ML ophthalmic solution INSTILL 1 DROP INTO EACH EYE TWICE DAILY  . latanoprost (XALATAN) 0.005 % ophthalmic solution INSTILL 1 DROP INTO EACH EYE AT BEDTIME  . losartan (COZAAR) 100 MG tablet Take 100 mg by mouth daily.  . metFORMIN (GLUCOPHAGE) 500 MG tablet Take 2 tablets by mouth daily.  . Misc Natural Products (GINSENG COMPLEX PO) Take 1 capsule by mouth daily.   . Multiple Vitamins-Minerals (MULTIVITAMIN GUMMIES MENS PO) Take 1 tablet by mouth daily.  . tamsulosin (FLOMAX) 0.4 MG CAPS capsule Take 0.4 mg by mouth daily.  Marland Kitchen LUMIGAN 0.01 % SOLN INSTILL 1 DROP INTO EACH EYE AT BEDTIME  . sildenafil (REVATIO) 20 MG tablet TAKE THREE TABLETS BY MOUTH AS NEEDED   No facility-administered encounter medications on file as of 08/28/2018.     ALLERGIES:  No Known Allergies   PHYSICAL EXAM:  ECOG Performance status: 1  Vitals:   08/28/18 0829  BP: (!) 141/75  Pulse: (!) 103  Temp: (!) 97.2 F (36.2 C)  SpO2: 98%   Filed Weights   08/28/18 0829  Weight: 222 lb 4.8 oz (100.8 kg)    Physical Exam Constitutional:      Appearance: Normal appearance.  Cardiovascular:     Rate and Rhythm: Normal rate and regular rhythm.     Heart sounds: Normal heart sounds.   Pulmonary:     Effort: Pulmonary effort is normal.     Breath sounds: Normal breath sounds.  Abdominal:     General: There is no distension.     Palpations: Abdomen is soft.     Tenderness: There is no abdominal tenderness.  Musculoskeletal:        General: No swelling.  Skin:    General: Skin is warm.  Neurological:     General: No  focal deficit present.     Mental Status: He is alert and oriented to person, place, and time.  Psychiatric:        Mood and Affect: Mood normal.        Behavior: Behavior normal.      LABORATORY DATA:  I have reviewed the labs as listed.  CBC    Component Value Date/Time   WBC 3.6 (L) 08/22/2018 0954   RBC 4.82 08/22/2018 0954   HGB 12.9 (L) 08/22/2018 0954   HCT 41.8 08/22/2018 0954   PLT 236 08/22/2018 0954   MCV 86.7 08/22/2018 0954   MCH 26.8 08/22/2018 0954   MCHC 30.9 08/22/2018 0954   RDW 17.2 (H) 08/22/2018 0954   LYMPHSABS 0.9 08/22/2018 0954   MONOABS 0.3 08/22/2018 0954   EOSABS 0.1 08/22/2018 0954   BASOSABS 0.0 08/22/2018 0954   CMP Latest Ref Rng & Units 08/22/2018 04/17/2018 09/05/2017  Glucose 70 - 99 mg/dL 126(H) 100(H) 99  BUN 8 - 23 mg/dL 15 13 15   Creatinine 0.61 - 1.24 mg/dL 0.99 0.91 1.17  Sodium 135 - 145 mmol/L 141 137 139  Potassium 3.5 - 5.1 mmol/L 3.8 3.8 3.3(L)  Chloride 98 - 111 mmol/L 107 106 102  CO2 22 - 32 mmol/L 24 23 26   Calcium 8.9 - 10.3 mg/dL 8.8(L) 8.6(L) 9.2  Total Protein 6.5 - 8.1 g/dL 6.9 7.3 7.3  Total Bilirubin 0.3 - 1.2 mg/dL 0.3 0.5 0.5  Alkaline Phos 38 - 126 U/L 53 49 48  AST 15 - 41 U/L 28 28 33  ALT 0 - 44 U/L 23 25 30        DIAGNOSTIC IMAGING:  I have independently reviewed the scans and discussed with the patient.   I have reviewed Venita Lick LPN's note and agree with the documentation.  I personally performed a face-to-face visit, made revisions and my assessment and plan is as follows.    ASSESSMENT & PLAN:   IDA (iron deficiency anemia) 1.  Iron deficiency  anemia: - Previous history of gastric erosions, diverticulosis and polyps, on intermittent Feraheme infusions.  - Denies any bleeding per rectum or melena.  Felt very well in terms of energy after last Feraheme infusion on 05/05/2018 and 05/12/2018. -His hemoglobin improved to 12.9.  However ferritin is still low at 34. - He will be requiring maintenance Feraheme infusions. -Hence I have recommended 2 infusions of Feraheme 1 week apart.  He will be seen back in 4 months with repeat blood work.  2.  Prostate cancer: He underwent seed implants 3 years ago.  He follows up with his urologist.  Reportedly his PSA was undetectable.      Orders placed this encounter:  Orders Placed This Encounter  Procedures  . Iron and TIBC  . Ferritin  . CBC with Differential/Platelet  . Comprehensive metabolic panel      Derek Jack, MD [place] [phone]

## 2018-08-28 NOTE — Patient Instructions (Addendum)
[place] at [facility] Discharge Instructions  You were seen today by Dr. Delton Coombes. He went over your recent lab results, your labs were good except your iron level. He would like to keep giving you Iron infusions to help with this. He will see you back in 4 months for labs and follow up.   Thank you for choosing [place] at [facility] to provide your oncology and hematology care.  To afford each patient quality time with our provider, please arrive at least 15 minutes before your scheduled appointment time.   If you have a lab appointment with the [facility] please come in thru the  Main Entrance and check in at the main information desk  You need to re-schedule your appointment should you arrive 10 or more minutes late.  We strive to give you quality time with our providers, and arriving late affects you and other patients whose appointments are after yours.  Also, if you no show three or more times for appointments you may be dismissed from the clinic at the providers discretion.     Again, thank you for choosing [facility].  Our hope is that these requests will decrease the amount of time that you wait before being seen by our physicians.       _____________________________________________________________  Should you have questions after your visit to [facility], please contact our office at [phone] between the hours of 8:00 a.m. and 4:30 p.m.  Voicemails left after 4:00 p.m. will not be returned until the following business day.  For prescription refill requests, have your pharmacy contact our office and allow 72 hours.    Cancer Center Support Programs:   > Cancer Support Group  2nd Tuesday of the month 1pm-2pm, Journey Room

## 2018-08-28 NOTE — Assessment & Plan Note (Signed)
1.  Iron deficiency anemia: - Previous history of gastric erosions, diverticulosis and polyps, on intermittent Feraheme infusions.  - Denies any bleeding per rectum or melena.  Felt very well in terms of energy after last Feraheme infusion on 05/05/2018 and 05/12/2018. -His hemoglobin improved to 12.9.  However ferritin is still low at 34. - He will be requiring maintenance Feraheme infusions. -Hence I have recommended 2 infusions of Feraheme 1 week apart.  He will be seen back in 4 months with repeat blood work.  2.  Prostate cancer: He underwent seed implants 3 years ago.  He follows up with his urologist.  Reportedly his PSA was undetectable.

## 2018-09-01 ENCOUNTER — Other Ambulatory Visit: Payer: Self-pay

## 2018-09-01 ENCOUNTER — Encounter (HOSPITAL_COMMUNITY): Payer: Self-pay

## 2018-09-01 ENCOUNTER — Inpatient Hospital Stay (HOSPITAL_COMMUNITY): Payer: Medicare Other | Attending: Hematology

## 2018-09-01 VITALS — BP 130/72 | HR 81 | Temp 97.3°F | Resp 18

## 2018-09-01 DIAGNOSIS — Z86010 Personal history of colonic polyps: Secondary | ICD-10-CM | POA: Diagnosis not present

## 2018-09-01 DIAGNOSIS — Z8719 Personal history of other diseases of the digestive system: Secondary | ICD-10-CM | POA: Diagnosis not present

## 2018-09-01 DIAGNOSIS — K5790 Diverticulosis of intestine, part unspecified, without perforation or abscess without bleeding: Secondary | ICD-10-CM | POA: Insufficient documentation

## 2018-09-01 DIAGNOSIS — D508 Other iron deficiency anemias: Secondary | ICD-10-CM | POA: Diagnosis not present

## 2018-09-01 DIAGNOSIS — D500 Iron deficiency anemia secondary to blood loss (chronic): Secondary | ICD-10-CM

## 2018-09-01 MED ORDER — SODIUM CHLORIDE 0.9% FLUSH
10.0000 mL | Freq: Once | INTRAVENOUS | Status: AC | PRN
  Administered 2018-09-01: 10 mL

## 2018-09-01 MED ORDER — SODIUM CHLORIDE 0.9 % IV SOLN
INTRAVENOUS | Status: DC
  Administered 2018-09-01: 09:00:00 via INTRAVENOUS

## 2018-09-01 MED ORDER — SODIUM CHLORIDE 0.9 % IV SOLN
510.0000 mg | Freq: Once | INTRAVENOUS | Status: AC
  Administered 2018-09-01: 510 mg via INTRAVENOUS
  Filled 2018-09-01: qty 510

## 2018-09-01 NOTE — Patient Instructions (Signed)
[facility] at [facility]  Discharge Instructions:   _______________________________________________________________  Thank you for choosing [facility] at [facility] to provide your oncology and hematology care.  To afford each patient quality time with our providers, please arrive at least 15 minutes before your scheduled appointment.  You need to re-schedule your appointment if you arrive 10 or more minutes late.  We strive to give you quality time with our providers, and arriving late affects you and other patients whose appointments are after yours.  Also, if you no show three or more times for appointments you may be dismissed from the clinic.  Again, thank you for choosing [facility] at [facility]. Our hope is that these requests will allow you access to exceptional care and in a timely manner. _______________________________________________________________  If you have questions after your visit, please contact our office at [phone] between the hours of 8:30 a.m. and 5:00 p.m. Voicemails left after 4:30 p.m. will not be returned until the following business day. _______________________________________________________________  For prescription refill requests, have your pharmacy contact our office. _______________________________________________________________  Recommendations made by the consultant and any test results will be sent to your referring physician. _______________________________________________________________ 

## 2018-09-01 NOTE — Progress Notes (Signed)
Patient tolerated iron infusion with no complaints voiced.  Peripheral IV with good blood return noted before and after infusion.  No bruising or swelling noted at site.  Band aid applied.  VSS with discharge and left ambulatory with no s/s of distress noted.

## 2018-09-05 DIAGNOSIS — E1169 Type 2 diabetes mellitus with other specified complication: Secondary | ICD-10-CM | POA: Diagnosis not present

## 2018-09-05 DIAGNOSIS — I10 Essential (primary) hypertension: Secondary | ICD-10-CM | POA: Diagnosis not present

## 2018-09-05 DIAGNOSIS — Z6834 Body mass index (BMI) 34.0-34.9, adult: Secondary | ICD-10-CM | POA: Diagnosis not present

## 2018-09-12 ENCOUNTER — Inpatient Hospital Stay (HOSPITAL_COMMUNITY): Payer: Medicare Other

## 2018-09-12 ENCOUNTER — Encounter (HOSPITAL_COMMUNITY): Payer: Self-pay

## 2018-09-12 ENCOUNTER — Other Ambulatory Visit: Payer: Self-pay

## 2018-09-12 VITALS — BP 131/70 | HR 77 | Temp 98.3°F | Resp 18

## 2018-09-12 DIAGNOSIS — Z86010 Personal history of colonic polyps: Secondary | ICD-10-CM | POA: Diagnosis not present

## 2018-09-12 DIAGNOSIS — D508 Other iron deficiency anemias: Secondary | ICD-10-CM | POA: Diagnosis not present

## 2018-09-12 DIAGNOSIS — K5790 Diverticulosis of intestine, part unspecified, without perforation or abscess without bleeding: Secondary | ICD-10-CM | POA: Diagnosis not present

## 2018-09-12 DIAGNOSIS — D500 Iron deficiency anemia secondary to blood loss (chronic): Secondary | ICD-10-CM

## 2018-09-12 DIAGNOSIS — Z8719 Personal history of other diseases of the digestive system: Secondary | ICD-10-CM | POA: Diagnosis not present

## 2018-09-12 MED ORDER — SODIUM CHLORIDE 0.9% FLUSH: 3.0000 mL | Freq: Once | INTRAVENOUS | Status: DC | PRN

## 2018-09-12 MED ORDER — SODIUM CHLORIDE 0.9 % IV SOLN
Freq: Once | INTRAVENOUS | Status: AC
  Administered 2018-09-12: 09:00:00 via INTRAVENOUS

## 2018-09-12 MED ORDER — ALTEPLASE 2 MG IJ SOLR: 2.0000 mg | Freq: Once | INTRAMUSCULAR | Status: DC | PRN

## 2018-09-12 MED ORDER — HEPARIN SOD (PORK) LOCK FLUSH 100 UNIT/ML IV SOLN: 250.0000 [IU] | Freq: Once | INTRAVENOUS | Status: DC | PRN

## 2018-09-12 MED ORDER — SODIUM CHLORIDE 0.9 % IV SOLN
510.0000 mg | Freq: Once | INTRAVENOUS | Status: AC
  Administered 2018-09-12: 510 mg via INTRAVENOUS
  Filled 2018-09-12: qty 17

## 2018-09-12 MED ORDER — HEPARIN SOD (PORK) LOCK FLUSH 100 UNIT/ML IV SOLN: 500.0000 [IU] | Freq: Once | INTRAVENOUS | Status: DC | PRN

## 2018-09-12 NOTE — Progress Notes (Signed)
Feraheme given today per MD orders. Tolerated infusion without adverse affects. Vital signs stable. No complaints at this time. Discharged from clinic ambulatory. F/U with [facility] as scheduled.  

## 2018-09-12 NOTE — Patient Instructions (Signed)
[facility] at [facility]  Discharge Instructions:   _______________________________________________________________  Thank you for choosing [facility] at [facility] to provide your oncology and hematology care.  To afford each patient quality time with our providers, please arrive at least 15 minutes before your scheduled appointment.  You need to re-schedule your appointment if you arrive 10 or more minutes late.  We strive to give you quality time with our providers, and arriving late affects you and other patients whose appointments are after yours.  Also, if you no show three or more times for appointments you may be dismissed from the clinic.  Again, thank you for choosing [facility] at [facility]. Our hope is that these requests will allow you access to exceptional care and in a timely manner. _______________________________________________________________  If you have questions after your visit, please contact our office at [phone] between the hours of 8:30 a.m. and 5:00 p.m. Voicemails left after 4:30 p.m. will not be returned until the following business day. _______________________________________________________________  For prescription refill requests, have your pharmacy contact our office. _______________________________________________________________  Recommendations made by the consultant and any test results will be sent to your referring physician. _______________________________________________________________ 

## 2018-09-25 DIAGNOSIS — H401121 Primary open-angle glaucoma, left eye, mild stage: Secondary | ICD-10-CM | POA: Diagnosis not present

## 2018-09-25 DIAGNOSIS — H02403 Unspecified ptosis of bilateral eyelids: Secondary | ICD-10-CM | POA: Diagnosis not present

## 2018-09-25 DIAGNOSIS — H524 Presbyopia: Secondary | ICD-10-CM | POA: Diagnosis not present

## 2018-09-25 DIAGNOSIS — H401112 Primary open-angle glaucoma, right eye, moderate stage: Secondary | ICD-10-CM | POA: Diagnosis not present

## 2018-12-25 ENCOUNTER — Other Ambulatory Visit: Payer: Self-pay

## 2018-12-25 ENCOUNTER — Inpatient Hospital Stay (HOSPITAL_COMMUNITY): Payer: Medicare Other | Attending: Hematology

## 2018-12-25 DIAGNOSIS — Z8546 Personal history of malignant neoplasm of prostate: Secondary | ICD-10-CM | POA: Diagnosis not present

## 2018-12-25 DIAGNOSIS — D500 Iron deficiency anemia secondary to blood loss (chronic): Secondary | ICD-10-CM

## 2018-12-25 DIAGNOSIS — D509 Iron deficiency anemia, unspecified: Secondary | ICD-10-CM | POA: Insufficient documentation

## 2018-12-25 LAB — FERRITIN: Ferritin: 194 ng/mL (ref 24–336)

## 2018-12-25 LAB — COMPREHENSIVE METABOLIC PANEL
ALT: 19 U/L (ref 0–44)
AST: 20 U/L (ref 15–41)
Albumin: 3.9 g/dL (ref 3.5–5.0)
Alkaline Phosphatase: 50 U/L (ref 38–126)
Anion gap: 8 (ref 5–15)
BUN: 13 mg/dL (ref 8–23)
CO2: 27 mmol/L (ref 22–32)
Calcium: 8.9 mg/dL (ref 8.9–10.3)
Chloride: 104 mmol/L (ref 98–111)
Creatinine, Ser: 0.94 mg/dL (ref 0.61–1.24)
GFR calc Af Amer: >60 mL/min (ref >60)
GFR calc non Af Amer: >60 mL/min (ref >60)
Glucose, Bld: 123 mg/dL — ABNORMAL HIGH (ref 70–99)
Potassium: 3.6 mmol/L (ref 3.5–5.1)
Sodium: 139 mmol/L (ref 135–145)
Total Bilirubin: 0.4 mg/dL (ref 0.3–1.2)
Total Protein: 7.1 g/dL (ref 6.5–8.1)

## 2018-12-25 LAB — CBC WITH DIFFERENTIAL/PLATELET
Abs Immature Granulocytes: 0.01 10*3/uL (ref 0.00–0.07)
Basophils Absolute: 0 10*3/uL (ref 0.0–0.1)
Basophils Relative: 1 %
Eosinophils Absolute: 0.1 10*3/uL (ref 0.0–0.5)
Eosinophils Relative: 3 %
HCT: 42.8 % (ref 39.0–52.0)
Hemoglobin: 13.4 g/dL (ref 13.0–17.0)
Immature Granulocytes: 0 %
Lymphocytes Relative: 22 %
Lymphs Abs: 1 10*3/uL (ref 0.7–4.0)
MCH: 27.5 pg (ref 26.0–34.0)
MCHC: 31.3 g/dL (ref 30.0–36.0)
MCV: 87.9 fL (ref 80.0–100.0)
Monocytes Absolute: 0.5 10*3/uL (ref 0.1–1.0)
Monocytes Relative: 11 %
Neutro Abs: 2.8 10*3/uL (ref 1.7–7.7)
Neutrophils Relative %: 63 %
Platelets: 204 10*3/uL (ref 150–400)
RBC: 4.87 MIL/uL (ref 4.22–5.81)
RDW: 17 % — ABNORMAL HIGH (ref 11.5–15.5)
WBC: 4.4 10*3/uL (ref 4.0–10.5)
nRBC: 0 % (ref 0.0–0.2)

## 2018-12-25 LAB — IRON AND TIBC
Iron: 20 ug/dL — ABNORMAL LOW (ref 45–182)
Saturation Ratios: 8 % — ABNORMAL LOW (ref 17.9–39.5)
TIBC: 263 ug/dL (ref 250–450)
UIBC: 243 ug/dL

## 2019-01-01 ENCOUNTER — Encounter (HOSPITAL_COMMUNITY): Payer: Self-pay | Admitting: Hematology

## 2019-01-01 ENCOUNTER — Inpatient Hospital Stay (HOSPITAL_COMMUNITY): Payer: Medicare Other | Attending: Hematology | Admitting: Hematology

## 2019-01-01 ENCOUNTER — Other Ambulatory Visit: Payer: Self-pay

## 2019-01-01 VITALS — BP 156/59 | HR 94 | Temp 98.7°F | Resp 16 | Wt 216.2 lb

## 2019-01-01 DIAGNOSIS — D500 Iron deficiency anemia secondary to blood loss (chronic): Secondary | ICD-10-CM

## 2019-01-01 DIAGNOSIS — Z8546 Personal history of malignant neoplasm of prostate: Secondary | ICD-10-CM | POA: Diagnosis not present

## 2019-01-01 DIAGNOSIS — D509 Iron deficiency anemia, unspecified: Secondary | ICD-10-CM | POA: Insufficient documentation

## 2019-01-01 NOTE — Patient Instructions (Addendum)
[facility] at [facility] Discharge Instructions  You were seen today by Dr. Katragadda. He went over your recent lab results. He will see you back in 3 months for labs and follow up.   Thank you for choosing [facility] at [facility] to provide your oncology and hematology care.  To afford each patient quality time with our provider, please arrive at least 15 minutes before your scheduled appointment time.   If you have a lab appointment with the Cancer Center please come in thru the  Main Entrance and check in at the main information desk  You need to re-schedule your appointment should you arrive 10 or more minutes late.  We strive to give you quality time with our providers, and arriving late affects you and other patients whose appointments are after yours.  Also, if you no show three or more times for appointments you may be dismissed from the clinic at the providers discretion.     Again, thank you for choosing [facility].  Our hope is that these requests will decrease the amount of time that you wait before being seen by our physicians.       _____________________________________________________________  Should you have questions after your visit to [facility], please contact our office at [phone] between the hours of 8:00 a.m. and 4:30 p.m.  Voicemails left after 4:00 p.m. will not be returned until the following business day.  For prescription refill requests, have your pharmacy contact our office and allow 72 hours.    Cancer Center Support Programs:   > Cancer Support Group  2nd Tuesday of the month 1pm-2pm, Journey Room    

## 2019-01-01 NOTE — Assessment & Plan Note (Signed)
1.  Iron deficiency anemia: - Previous history of gastric erosions, diverticulosis and polyps, and intermittent Feraheme infusions. - Denies any bleeding per rectum or melena. - Feraheme infusion on 09/01/2018 and 09/12/2018. -His energy levels have improved tremendously after iron infusions. -We reviewed blood work today.  Hemoglobin improved to 13.4.  Ferritin improved to 194 from 35. - I did not recommend any further iron therapy at this time.  We will reevaluate him in 3 months with repeat labs.  2.  Prostate cancer: - He underwent seed implants 3 years ago.  He follows up with his urologist.

## 2019-01-01 NOTE — Progress Notes (Signed)
[place], [place] [address]   CLINIC:  Medical Oncology/Hematology  PCP:  [name], MD [address] [place] [address] [phone]   REASON FOR VISIT:  Follow-up for iron deficiency anemia  CURRENT THERAPY: intermittent iron infusions    INTERVAL HISTORY:  [name] 83 y.o. male seen for follow-up of anemia.  His last Feraheme infusion was on 09/12/2018.  His energy levels are reported as 100%.  His energy levels improved tremendously after his infusion of iron.  Denies any bleeding per rectum or melena.  Appetite is 100%.  Denies any nausea vomiting diarrhea or constipation.  No fevers or night sweats reported.    REVIEW OF SYSTEMS:  Review of Systems  All other systems reviewed and are negative.    PAST MEDICAL/SURGICAL HISTORY:  Past Medical History:  Diagnosis Date  . Arthritis   . At risk for sleep apnea    STOP-BANG= 5   SENT TO PCP 01-22-2014  . Borderline type 2 diabetes mellitus   . Hyperlipidemia   . Hypertension   . IDA (iron deficiency anemia) 07/27/2016  . Malignant neoplasm of prostate ([place]) 12/04/2013  . Prostate cancer ([place]) DX  2013   Gleason 3+3=6, vol 42.34 cc  . Wears glasses    Past Surgical History:  Procedure Laterality Date  . APPENDECTOMY  age 77  . COLONOSCOPY N/A 05/05/2016   Procedure: COLONOSCOPY;  Surgeon: Daneil Dolin, MD;  [facility];  Service: Endoscopy;  Laterality: N/A;  815  . COLONOSCOPY  01/2015   Dr. Carol Ada: hyperplastic polyps. H/O tubulovillous adenoma as well.   . COLONOSCOPY W/ BIOPSIES  2009   non-cancerous  . ESOPHAGOGASTRODUODENOSCOPY N/A 05/05/2016   Procedure: ESOPHAGOGASTRODUODENOSCOPY (EGD);  Surgeon: Daneil Dolin, MD;  [facility];  Service: Endoscopy;  Laterality: N/A;  . GIVENS CAPSULE STUDY N/A 09/06/2016   Procedure: GIVENS CAPSULE STUDY;  Surgeon: Daneil Dolin, MD;  [facility];  Service: Endoscopy;   Laterality: N/A;  7:30am (patient will arrive at 7:00am)  . PROSTATE BIOPSY  11/05/13   adenocarcinoma, gleason 6  . RADIOACTIVE SEED IMPLANT N/A 01/29/2014   Procedure: RADIOACTIVE SEED IMPLANT;  Surgeon: Arvil Persons, MD;  Location[facility];  Service: Urology;  Laterality: N/A;     seeds implanted no seeds found in bladder     SOCIAL HISTORY:  Social History   Socioeconomic History  . Marital status: Married    Spouse name: Not on file  . Number of children: Not on file  . Years of education: Not on file  . Highest education level: Not on file  Occupational History  . Not on file  Social Needs  . Financial resource strain: Not on file  . Food insecurity    Worry: Not on file    Inability: Not on file  . Transportation needs    Medical: Not on file    Non-medical: Not on file  Tobacco Use  . Smoking status: Former Smoker    Packs/day: 1.00    Years: 25.00    Pack years: 25.00    Types: Cigarettes, Pipe, Cigars    Quit date: 12/04/1973    Years since quitting: 45.1  . Smokeless tobacco: Never Used  Substance and Sexual Activity  . Alcohol use: Yes    Comment: occasionally brandy  . Drug use: No  . Sexual activity: Not on file  Lifestyle  .  Physical activity    Days per week: Not on file    Minutes per session: Not on file  . Stress: Not on file  Relationships  . Social Herbalist on phone: Not on file    Gets together: Not on file    Attends religious service: Not on file    Active member of club or organization: Not on file    Attends meetings of clubs or organizations: Not on file    Relationship status: Not on file  . Intimate partner violence    Fear of current or ex partner: Not on file    Emotionally abused: Not on file    Physically abused: Not on file    Forced sexual activity: Not on file  Other Topics Concern  . Not on file  Social History Narrative  . Not on file    FAMILY HISTORY:  Family History  Problem Relation  Age of Onset  . Diabetes Mother   . Stroke Father   . Cancer Cousin        prostate  . Colon cancer Neg Hx     CURRENT MEDICATIONS:  Outpatient Encounter Medications as of 01/01/2019  Medication Sig  . aspirin EC 81 MG tablet Take 81 mg by mouth daily.  Marland Kitchen docusate sodium (COLACE) 100 MG capsule Take 200 mg by mouth daily.  . dorzolamide-timolol (COSOPT) 22.3-6.8 MG/ML ophthalmic solution INSTILL 1 DROP INTO EACH EYE TWICE DAILY  . latanoprost (XALATAN) 0.005 % ophthalmic solution INSTILL 1 DROP INTO EACH EYE AT BEDTIME  . losartan (COZAAR) 100 MG tablet Take 100 mg by mouth daily.  Marland Kitchen LUMIGAN 0.01 % SOLN INSTILL 1 DROP INTO EACH EYE AT BEDTIME  . metFORMIN (GLUCOPHAGE) 500 MG tablet Take 2 tablets by mouth daily.  . Misc Natural Products (GINSENG COMPLEX PO) Take 1 capsule by mouth daily.   . Multiple Vitamins-Minerals (MULTIVITAMIN GUMMIES MENS PO) Take 1 tablet by mouth daily.  . sildenafil (REVATIO) 20 MG tablet TAKE THREE TABLETS BY MOUTH AS NEEDED  . tamsulosin (FLOMAX) 0.4 MG CAPS capsule Take 0.4 mg by mouth daily.   No facility-administered encounter medications on file as of 01/01/2019.     ALLERGIES:  No Known Allergies   PHYSICAL EXAM:  ECOG Performance status: 1  Vitals:   01/01/19 1100  BP: (!) 156/59  Pulse: 94  Resp: 16  Temp: 98.7 F (37.1 C)  SpO2: 99%   Filed Weights   01/01/19 1100  Weight: 216 lb 4 oz (98.1 kg)    Physical Exam Constitutional:      Appearance: Normal appearance.  Cardiovascular:     Rate and Rhythm: Normal rate and regular rhythm.     Heart sounds: Normal heart sounds.  Pulmonary:     Effort: Pulmonary effort is normal.     Breath sounds: Normal breath sounds.  Abdominal:     General: There is no distension.     Palpations: Abdomen is soft.     Tenderness: There is no abdominal tenderness.  Musculoskeletal:        General: No swelling.  Skin:    General: Skin is warm.  Neurological:     General: No focal deficit present.      Mental Status: He is alert and oriented to person, place, and time.  Psychiatric:        Mood and Affect: Mood normal.        Behavior: Behavior normal.      LABORATORY  DATA:  I have reviewed the labs as listed.  CBC    Component Value Date/Time   WBC 4.4 12/25/2018 1131   RBC 4.87 12/25/2018 1131   HGB 13.4 12/25/2018 1131   HCT 42.8 12/25/2018 1131   PLT 204 12/25/2018 1131   MCV 87.9 12/25/2018 1131   MCH 27.5 12/25/2018 1131   MCHC 31.3 12/25/2018 1131   RDW 17.0 (H) 12/25/2018 1131   LYMPHSABS 1.0 12/25/2018 1131   MONOABS 0.5 12/25/2018 1131   EOSABS 0.1 12/25/2018 1131   BASOSABS 0.0 12/25/2018 1131   CMP Latest Ref Rng & Units 12/25/2018 08/22/2018 04/17/2018  Glucose 70 - 99 mg/dL 123(H) 126(H) 100(H)  BUN 8 - 23 mg/dL 13 15 13   Creatinine 0.61 - 1.24 mg/dL 0.94 0.99 0.91  Sodium 135 - 145 mmol/L 139 141 137  Potassium 3.5 - 5.1 mmol/L 3.6 3.8 3.8  Chloride 98 - 111 mmol/L 104 107 106  CO2 22 - 32 mmol/L 27 24 23   Calcium 8.9 - 10.3 mg/dL 8.9 8.8(L) 8.6(L)  Total Protein 6.5 - 8.1 g/dL 7.1 6.9 7.3  Total Bilirubin 0.3 - 1.2 mg/dL 0.4 0.3 0.5  Alkaline Phos 38 - 126 U/L 50 53 49  AST 15 - 41 U/L 20 28 28   ALT 0 - 44 U/L 19 23 25        DIAGNOSTIC IMAGING:  I have independently reviewed the scans and discussed with the patient.   I have reviewed Venita Lick LPN's note and agree with the documentation.  I personally performed a face-to-face visit, made revisions and my assessment and plan is as follows.    ASSESSMENT & PLAN:   IDA (iron deficiency anemia) 1.  Iron deficiency anemia: - Previous history of gastric erosions, diverticulosis and polyps, and intermittent Feraheme infusions. - Denies any bleeding per rectum or melena. - Feraheme infusion on 09/01/2018 and 09/12/2018. -His energy levels have improved tremendously after iron infusions. -We reviewed blood work today.  Hemoglobin improved to 13.4.  Ferritin improved to 194 from 35. - I did  not recommend any further iron therapy at this time.  We will reevaluate him in 3 months with repeat labs.  2.  Prostate cancer: - He underwent seed implants 3 years ago.  He follows up with his urologist.      Orders placed this encounter:  Orders Placed This Encounter  Procedures  . CBC with Differential  . Iron and TIBC  . Ferritin  . Vitamin B12  . Folate      Derek Jack, MD [place] [phone]

## 2019-01-05 DIAGNOSIS — E1169 Type 2 diabetes mellitus with other specified complication: Secondary | ICD-10-CM | POA: Diagnosis not present

## 2019-01-05 DIAGNOSIS — E782 Mixed hyperlipidemia: Secondary | ICD-10-CM | POA: Diagnosis not present

## 2019-01-05 DIAGNOSIS — N5201 Erectile dysfunction due to arterial insufficiency: Secondary | ICD-10-CM | POA: Diagnosis not present

## 2019-01-05 DIAGNOSIS — I509 Heart failure, unspecified: Secondary | ICD-10-CM | POA: Diagnosis not present

## 2019-01-05 DIAGNOSIS — C61 Malignant neoplasm of prostate: Secondary | ICD-10-CM | POA: Diagnosis not present

## 2019-01-05 DIAGNOSIS — I10 Essential (primary) hypertension: Secondary | ICD-10-CM | POA: Diagnosis not present

## 2019-01-17 ENCOUNTER — Ambulatory Visit (INDEPENDENT_AMBULATORY_CARE_PROVIDER_SITE_OTHER): Payer: Medicare Other | Admitting: Urology

## 2019-01-17 DIAGNOSIS — N5201 Erectile dysfunction due to arterial insufficiency: Secondary | ICD-10-CM

## 2019-01-17 DIAGNOSIS — N401 Enlarged prostate with lower urinary tract symptoms: Secondary | ICD-10-CM

## 2019-01-17 DIAGNOSIS — C61 Malignant neoplasm of prostate: Secondary | ICD-10-CM | POA: Diagnosis not present

## 2019-01-22 DIAGNOSIS — C61 Malignant neoplasm of prostate: Secondary | ICD-10-CM | POA: Diagnosis not present

## 2019-03-20 DIAGNOSIS — H40011 Open angle with borderline findings, low risk, right eye: Secondary | ICD-10-CM | POA: Diagnosis not present

## 2019-04-03 ENCOUNTER — Inpatient Hospital Stay (HOSPITAL_COMMUNITY): Payer: Medicare Other | Attending: Hematology

## 2019-04-10 ENCOUNTER — Ambulatory Visit (HOSPITAL_COMMUNITY): Payer: Medicare Other | Admitting: Hematology

## 2019-05-09 DIAGNOSIS — E782 Mixed hyperlipidemia: Secondary | ICD-10-CM | POA: Diagnosis not present

## 2019-05-09 DIAGNOSIS — F064 Anxiety disorder due to known physiological condition: Secondary | ICD-10-CM | POA: Diagnosis not present

## 2019-05-09 DIAGNOSIS — I10 Essential (primary) hypertension: Secondary | ICD-10-CM | POA: Diagnosis not present

## 2019-05-09 DIAGNOSIS — E1169 Type 2 diabetes mellitus with other specified complication: Secondary | ICD-10-CM | POA: Diagnosis not present

## 2019-05-10 DIAGNOSIS — Z23 Encounter for immunization: Secondary | ICD-10-CM | POA: Diagnosis not present

## 2019-05-21 ENCOUNTER — Other Ambulatory Visit: Payer: Self-pay

## 2019-05-21 DIAGNOSIS — C61 Malignant neoplasm of prostate: Secondary | ICD-10-CM

## 2019-06-18 ENCOUNTER — Encounter: Payer: Self-pay | Admitting: Urology

## 2019-07-15 ENCOUNTER — Ambulatory Visit: Payer: Medicare Other

## 2019-07-26 DIAGNOSIS — Z23 Encounter for immunization: Secondary | ICD-10-CM | POA: Diagnosis not present

## 2019-08-09 DIAGNOSIS — Z0000 Encounter for general adult medical examination without abnormal findings: Secondary | ICD-10-CM | POA: Diagnosis not present

## 2019-08-24 DIAGNOSIS — Z23 Encounter for immunization: Secondary | ICD-10-CM | POA: Diagnosis not present

## 2019-09-07 DIAGNOSIS — E1169 Type 2 diabetes mellitus with other specified complication: Secondary | ICD-10-CM | POA: Diagnosis not present

## 2019-09-07 DIAGNOSIS — I10 Essential (primary) hypertension: Secondary | ICD-10-CM | POA: Diagnosis not present

## 2019-09-07 DIAGNOSIS — N5201 Erectile dysfunction due to arterial insufficiency: Secondary | ICD-10-CM | POA: Diagnosis not present

## 2019-09-07 DIAGNOSIS — E782 Mixed hyperlipidemia: Secondary | ICD-10-CM | POA: Diagnosis not present

## 2019-09-12 ENCOUNTER — Ambulatory Visit: Payer: Medicare Other | Admitting: Urology

## 2019-09-28 ENCOUNTER — Other Ambulatory Visit: Payer: Self-pay | Admitting: Family Medicine

## 2019-09-28 DIAGNOSIS — R1310 Dysphagia, unspecified: Secondary | ICD-10-CM

## 2019-09-28 DIAGNOSIS — K219 Gastro-esophageal reflux disease without esophagitis: Secondary | ICD-10-CM | POA: Diagnosis not present

## 2019-10-01 ENCOUNTER — Other Ambulatory Visit: Payer: Self-pay | Admitting: Urology

## 2019-10-01 DIAGNOSIS — C61 Malignant neoplasm of prostate: Secondary | ICD-10-CM | POA: Diagnosis not present

## 2019-10-02 LAB — PSA: PSA: <0.1 ng/mL (ref <=4.0)

## 2019-10-04 ENCOUNTER — Ambulatory Visit (HOSPITAL_COMMUNITY)
Admission: RE | Admit: 2019-10-04 | Discharge: 2019-10-04 | Disposition: A | Discharge status: Home / Self Care | Payer: Medicare Other | Source: Ambulatory Visit | Attending: Family Medicine | Admitting: Family Medicine

## 2019-10-04 ENCOUNTER — Other Ambulatory Visit: Payer: Self-pay

## 2019-10-04 DIAGNOSIS — R1310 Dysphagia, unspecified: Secondary | ICD-10-CM | POA: Insufficient documentation

## 2019-10-04 DIAGNOSIS — K224 Dyskinesia of esophagus: Secondary | ICD-10-CM | POA: Diagnosis not present

## 2019-10-08 ENCOUNTER — Other Ambulatory Visit: Payer: Self-pay

## 2019-10-08 ENCOUNTER — Encounter: Payer: Self-pay | Admitting: Urology

## 2019-10-08 ENCOUNTER — Ambulatory Visit (INDEPENDENT_AMBULATORY_CARE_PROVIDER_SITE_OTHER): Payer: Medicare Other | Admitting: Urology

## 2019-10-08 VITALS — BP 156/79 | HR 93 | Temp 98.1°F | Ht 69.0 in | Wt 216.0 lb

## 2019-10-08 DIAGNOSIS — N401 Enlarged prostate with lower urinary tract symptoms: Secondary | ICD-10-CM | POA: Diagnosis not present

## 2019-10-08 DIAGNOSIS — N138 Other obstructive and reflux uropathy: Secondary | ICD-10-CM | POA: Insufficient documentation

## 2019-10-08 DIAGNOSIS — R3912 Poor urinary stream: Secondary | ICD-10-CM | POA: Diagnosis not present

## 2019-10-08 DIAGNOSIS — C61 Malignant neoplasm of prostate: Secondary | ICD-10-CM | POA: Diagnosis not present

## 2019-10-08 MED ORDER — TAMSULOSIN HCL 0.4 MG PO CAPS: 0.4000 mg | ORAL_CAPSULE | Freq: Every day | ORAL | 3 refills | Status: DC

## 2019-10-08 NOTE — Progress Notes (Signed)

## 2019-10-08 NOTE — Progress Notes (Signed)
10/08/2019 11:59 AM   Bruce Duarte Sep 12, 1935 [id number]  Referring provider: [name], MD [address] [place],  [place] [id number]  Weak stream  HPI: Mr Bruce Duarte is a 84yo here for followup for prostate cancer, BPH with LUTS, weak stream. PSA remains undetectable. He is on flomax 0.4mg  daily for his weak urinary stream which has significantly improved her stream. NO other significant LUTS. He has ED for which he did take sildenafil.     HIs records from [facility] are as follows: <HTML><META HTTP-EQUIV="content-type" CONTENT="text/html;charset=utf-8"><B>04/14/2016: PSA from 06/2015 was stable at 0.25. 0.23 6 months ago. 0.1 today. he had brachytherapy in 01/2014 <BR><BR>He takes flomax 0.4mg  for his LUTS and his nocturia, frequency, urgency is improved. He has occasional urgency. He is satified with his LUTS on the flomax. <BR><BR>08/18/2016: PSA is stable at 0.1 <BR><BR>05/18/2017: PSa is undetectable. no new LUTS. He is on flomax <BR><BR>11/16/2017: PSA remains undetectable <BR><BR>01/17/2019: no recent PSA </B><BR> 04/14/2016: He was doing well on flomax 0.4mg daily but ran out of the medication 2 weeks ago. His LUTS have worsened   08/18/2016: He has a good stream, nocturia 0x, no urgency. He is on flomax 0.4mg  daily   05/18/2017: He is on flomax 0.4mg  and has mild LUTS.   11/16/2017: He has stable LUTS on floma x0.4mg  daily. stable nocturia 0-1x   01/17/2019: No new LUTS. He is on flomax 0.4mg  daily. Nocturia stable. good stream     PMH: Past Medical History:  Diagnosis Date  . Arthritis   . At risk for sleep apnea    STOP-BANG= 5   SENT TO PCP 01-22-2014  . Borderline type 2 diabetes mellitus   . Hyperlipidemia   . Hypertension   . IDA (iron deficiency anemia) 07/27/2016  . Malignant neoplasm of prostate ([place]) 12/04/2013  . Prostate cancer ([place]) DX  2013   Gleason 3+3=6, vol 42.34 cc  . Wears glasses     Surgical History: Past Surgical History:  Procedure Laterality  Date  . APPENDECTOMY  age 15  . COLONOSCOPY N/A 05/05/2016   Procedure: COLONOSCOPY;  Surgeon: Daneil Dolin, MD;  [facility];  Service: Endoscopy;  Laterality: N/A;  815  . COLONOSCOPY  01/2015   Dr. Carol Ada: hyperplastic polyps. H/O tubulovillous adenoma as well.   . COLONOSCOPY W/ BIOPSIES  2009   non-cancerous  . ESOPHAGOGASTRODUODENOSCOPY N/A 05/05/2016   Procedure: ESOPHAGOGASTRODUODENOSCOPY (EGD);  Surgeon: Daneil Dolin, MD;  [facility];  Service: Endoscopy;  Laterality: N/A;  . GIVENS CAPSULE STUDY N/A 09/06/2016   Procedure: GIVENS CAPSULE STUDY;  Surgeon: Daneil Dolin, MD;  [facility];  Service: Endoscopy;  Laterality: N/A;  7:30am (patient will arrive at 7:00am)  . PROSTATE BIOPSY  11/05/13   adenocarcinoma, gleason 6  . RADIOACTIVE SEED IMPLANT N/A 01/29/2014   Procedure: RADIOACTIVE SEED IMPLANT;  Surgeon: Arvil Persons, MD;  Location[facility];  Service: Urology;  Laterality: N/A;     seeds implanted no seeds found in bladder    Home Medications:  Allergies as of 10/08/2019   No Known Allergies     Medication List       Accurate as of Oct 08, 2019 11:59 AM. If you have any questions, ask your nurse or doctor.        aspirin EC 81 MG tablet Take 81 mg by mouth daily.   docusate sodium 100 MG capsule Commonly known as: COLACE Take 200 mg  by mouth daily.   dorzolamide-timolol 22.3-6.8 MG/ML ophthalmic solution Commonly known as: COSOPT INSTILL 1 DROP INTO EACH EYE TWICE DAILY   GINSENG COMPLEX PO Take 1 capsule by mouth daily.   latanoprost 0.005 % ophthalmic solution Commonly known as: XALATAN INSTILL 1 DROP INTO EACH EYE AT BEDTIME   losartan 100 MG tablet Commonly known as: COZAAR Take 100 mg by mouth daily.   losartan-hydrochlorothiazide 100-25 MG tablet Commonly known as: HYZAAR Take 1 tablet by mouth daily.   Lumigan 0.01 % Soln Generic drug: bimatoprost INSTILL 1 DROP INTO EACH EYE AT  BEDTIME   metFORMIN 500 MG tablet Commonly known as: GLUCOPHAGE Take 2 tablets by mouth daily.   MULTIVITAMIN GUMMIES MENS PO Take 1 tablet by mouth daily.   sildenafil 20 MG tablet Commonly known as: REVATIO TAKE THREE TABLETS BY MOUTH AS NEEDED   tamsulosin 0.4 MG Caps capsule Commonly known as: FLOMAX Take 0.4 mg by mouth daily.       Allergies: No Known Allergies  Family History: Family History  Problem Relation Age of Onset  . Diabetes Mother   . Stroke Father   . Cancer Cousin        prostate  . Colon cancer Neg Hx     Social History:  reports that he quit smoking about 45 years ago. His smoking use included cigarettes, pipe, and cigars. He has a 25.00 pack-year smoking history. He has never used smokeless tobacco. He reports current alcohol use. He reports that he does not use drugs.  ROS: All other review of systems were reviewed and are negative except what is noted above in HPI  Physical Exam: BP (!) 156/79   Pulse 93   Temp 98.1 F (36.7 C)   Ht 5\' 9"  (1.753 m)   Wt 216 lb (98 kg)   BMI 31.90 kg/m   Constitutional:  Alert and oriented, No acute distress. HEENT: [place] AT, moist mucus membranes.  Trachea midline, no masses. Cardiovascular: No clubbing, cyanosis, or edema. Respiratory: Normal respiratory effort, no increased work of breathing. GI: Abdomen is soft, nontender, nondistended, no abdominal masses GU: No CVA tenderness.  Lymph: No cervical or inguinal lymphadenopathy. Skin: No rashes, bruises or suspicious lesions. Neurologic: Grossly intact, no focal deficits, moving all 4 extremities. Psychiatric: Normal mood and affect.  Laboratory Data: Lab Results  Component Value Date   WBC 4.4 12/25/2018   HGB 13.4 12/25/2018   HCT 42.8 12/25/2018   MCV 87.9 12/25/2018   PLT 204 12/25/2018    Lab Results  Component Value Date   CREATININE 0.94 12/25/2018    Lab Results  Component Value Date   PSA <0.1 10/01/2019    No results found  for: TESTOSTERONE  No results found for: HGBA1C  Urinalysis No results found for: COLORURINE, APPEARANCEUR, LABSPEC, PHURINE, GLUCOSEU, HGBUR, BILIRUBINUR, KETONESUR, PROTEINUR, UROBILINOGEN, NITRITE, LEUKOCYTESUR  No results found for: LABMICR, [place], RBCUA, LABEPIT, MUCUS, BACTERIA  Pertinent Imaging:  No results found for this or any previous visit. No results found for this or any previous visit. No results found for this or any previous visit. No results found for this or any previous visit. No results found for this or any previous visit. No results found for this or any previous visit. No results found for this or any previous visit. No results found for this or any previous visit.  Assessment & Plan:    1. Prostate cancer ([place]) -RTC 1 year with PSA and DRE  2. Benign prostatic  hyperplasia with urinary obstruction Continue flomax 0.4mg  daily  3. Weak urinary stream Continue flomax 0.4mg  daily   No follow-ups on file.  Nicolette Bang, MD  [facility] [place]

## 2019-10-08 NOTE — Patient Instructions (Signed)

## 2019-10-09 DIAGNOSIS — E782 Mixed hyperlipidemia: Secondary | ICD-10-CM | POA: Diagnosis not present

## 2019-10-09 DIAGNOSIS — I10 Essential (primary) hypertension: Secondary | ICD-10-CM | POA: Diagnosis not present

## 2019-10-09 DIAGNOSIS — C61 Malignant neoplasm of prostate: Secondary | ICD-10-CM | POA: Diagnosis not present

## 2019-10-09 DIAGNOSIS — E0843 Diabetes mellitus due to underlying condition with diabetic autonomic (poly)neuropathy: Secondary | ICD-10-CM | POA: Diagnosis not present

## 2019-10-29 DIAGNOSIS — E1169 Type 2 diabetes mellitus with other specified complication: Secondary | ICD-10-CM | POA: Diagnosis not present

## 2019-10-29 DIAGNOSIS — N401 Enlarged prostate with lower urinary tract symptoms: Secondary | ICD-10-CM | POA: Diagnosis not present

## 2019-10-29 DIAGNOSIS — Z7984 Long term (current) use of oral hypoglycemic drugs: Secondary | ICD-10-CM | POA: Diagnosis not present

## 2019-10-29 DIAGNOSIS — I10 Essential (primary) hypertension: Secondary | ICD-10-CM | POA: Diagnosis not present

## 2019-12-11 ENCOUNTER — Other Ambulatory Visit: Payer: Self-pay | Admitting: Family Medicine

## 2019-12-11 ENCOUNTER — Other Ambulatory Visit (HOSPITAL_COMMUNITY): Payer: Self-pay | Admitting: Family Medicine

## 2019-12-11 DIAGNOSIS — Z6828 Body mass index (BMI) 28.0-28.9, adult: Secondary | ICD-10-CM | POA: Diagnosis not present

## 2019-12-11 DIAGNOSIS — R634 Abnormal weight loss: Secondary | ICD-10-CM | POA: Diagnosis not present

## 2019-12-11 DIAGNOSIS — Z1289 Encounter for screening for malignant neoplasm of other sites: Secondary | ICD-10-CM | POA: Diagnosis not present

## 2019-12-11 DIAGNOSIS — R1030 Lower abdominal pain, unspecified: Secondary | ICD-10-CM

## 2019-12-11 DIAGNOSIS — I10 Essential (primary) hypertension: Secondary | ICD-10-CM | POA: Diagnosis not present

## 2019-12-11 DIAGNOSIS — C189 Malignant neoplasm of colon, unspecified: Secondary | ICD-10-CM | POA: Diagnosis not present

## 2019-12-14 ENCOUNTER — Emergency Department (HOSPITAL_COMMUNITY)
Admission: EM | Admit: 2019-12-14 | Discharge: 2019-12-14 | Disposition: A | Discharge status: Home / Self Care | Payer: Medicare Other | Attending: Emergency Medicine | Admitting: Emergency Medicine

## 2019-12-14 ENCOUNTER — Other Ambulatory Visit: Payer: Self-pay | Admitting: Urology

## 2019-12-14 ENCOUNTER — Emergency Department (HOSPITAL_COMMUNITY): Payer: Medicare Other

## 2019-12-14 ENCOUNTER — Encounter (HOSPITAL_COMMUNITY): Payer: Self-pay | Admitting: Emergency Medicine

## 2019-12-14 ENCOUNTER — Other Ambulatory Visit: Payer: Self-pay

## 2019-12-14 DIAGNOSIS — Z7984 Long term (current) use of oral hypoglycemic drugs: Secondary | ICD-10-CM | POA: Insufficient documentation

## 2019-12-14 DIAGNOSIS — C439 Malignant melanoma of skin, unspecified: Secondary | ICD-10-CM | POA: Diagnosis not present

## 2019-12-14 DIAGNOSIS — C799 Secondary malignant neoplasm of unspecified site: Secondary | ICD-10-CM

## 2019-12-14 DIAGNOSIS — I10 Essential (primary) hypertension: Secondary | ICD-10-CM | POA: Diagnosis not present

## 2019-12-14 DIAGNOSIS — C23 Malignant neoplasm of gallbladder: Secondary | ICD-10-CM | POA: Insufficient documentation

## 2019-12-14 DIAGNOSIS — R000 Tachycardia, unspecified: Secondary | ICD-10-CM | POA: Insufficient documentation

## 2019-12-14 DIAGNOSIS — Z87891 Personal history of nicotine dependence: Secondary | ICD-10-CM | POA: Diagnosis not present

## 2019-12-14 DIAGNOSIS — C61 Malignant neoplasm of prostate: Secondary | ICD-10-CM | POA: Insufficient documentation

## 2019-12-14 DIAGNOSIS — Z79899 Other long term (current) drug therapy: Secondary | ICD-10-CM | POA: Insufficient documentation

## 2019-12-14 DIAGNOSIS — N50819 Testicular pain, unspecified: Secondary | ICD-10-CM | POA: Diagnosis not present

## 2019-12-14 DIAGNOSIS — R109 Unspecified abdominal pain: Secondary | ICD-10-CM | POA: Diagnosis present

## 2019-12-14 DIAGNOSIS — R10817 Generalized abdominal tenderness: Secondary | ICD-10-CM | POA: Diagnosis not present

## 2019-12-14 DIAGNOSIS — N179 Acute kidney failure, unspecified: Secondary | ICD-10-CM | POA: Diagnosis not present

## 2019-12-14 DIAGNOSIS — R7303 Prediabetes: Secondary | ICD-10-CM | POA: Insufficient documentation

## 2019-12-14 DIAGNOSIS — D649 Anemia, unspecified: Secondary | ICD-10-CM | POA: Insufficient documentation

## 2019-12-14 LAB — CBC
HCT: 32.4 % — ABNORMAL LOW (ref 39.0–52.0)
Hemoglobin: 10.1 g/dL — ABNORMAL LOW (ref 13.0–17.0)
MCH: 25.8 pg — ABNORMAL LOW (ref 26.0–34.0)
MCHC: 31.2 g/dL (ref 30.0–36.0)
MCV: 82.7 fL (ref 80.0–100.0)
Platelets: 319 10*3/uL (ref 150–400)
RBC: 3.92 MIL/uL — ABNORMAL LOW (ref 4.22–5.81)
RDW: 18.1 % — ABNORMAL HIGH (ref 11.5–15.5)
WBC: 4.4 10*3/uL (ref 4.0–10.5)
nRBC: 0 % (ref 0.0–0.2)

## 2019-12-14 LAB — COMPREHENSIVE METABOLIC PANEL
ALT: 18 U/L (ref 0–44)
AST: 20 U/L (ref 15–41)
Albumin: 4.2 g/dL (ref 3.5–5.0)
Alkaline Phosphatase: 68 U/L (ref 38–126)
Anion gap: 12 (ref 5–15)
BUN: 31 mg/dL — ABNORMAL HIGH (ref 8–23)
CO2: 28 mmol/L (ref 22–32)
Calcium: 9.5 mg/dL (ref 8.9–10.3)
Chloride: 93 mmol/L — ABNORMAL LOW (ref 98–111)
Creatinine, Ser: 1.67 mg/dL — ABNORMAL HIGH (ref 0.61–1.24)
GFR calc Af Amer: 44 mL/min — ABNORMAL LOW (ref >60)
GFR calc non Af Amer: 38 mL/min — ABNORMAL LOW (ref >60)
Glucose, Bld: 100 mg/dL — ABNORMAL HIGH (ref 70–99)
Potassium: 3.9 mmol/L (ref 3.5–5.1)
Sodium: 133 mmol/L — ABNORMAL LOW (ref 135–145)
Total Bilirubin: 0.6 mg/dL (ref 0.3–1.2)
Total Protein: 7.3 g/dL (ref 6.5–8.1)

## 2019-12-14 LAB — URINALYSIS, ROUTINE W REFLEX MICROSCOPIC
Bacteria, UA: NONE SEEN
Bilirubin Urine: NEGATIVE
Glucose, UA: NEGATIVE mg/dL
Hgb urine dipstick: NEGATIVE
Ketones, ur: NEGATIVE mg/dL
Leukocytes,Ua: NEGATIVE
Nitrite: NEGATIVE
Protein, ur: 30 mg/dL — AB
Specific Gravity, Urine: 1.019 (ref 1.005–1.030)
pH: 5 (ref 5.0–8.0)

## 2019-12-14 LAB — POC OCCULT BLOOD, ED: Fecal Occult Bld: POSITIVE — AB

## 2019-12-14 LAB — LIPASE, BLOOD: Lipase: 113 U/L — ABNORMAL HIGH (ref 11–51)

## 2019-12-14 MED ORDER — IOHEXOL 300 MG/ML  SOLN
75.0000 mL | Freq: Once | INTRAMUSCULAR | Status: AC | PRN
  Administered 2019-12-14: 75 mL via INTRAVENOUS

## 2019-12-14 MED ORDER — ONDANSETRON 4 MG PO TBDP: 4.0000 mg | ORAL_TABLET | Freq: Three times a day (TID) | ORAL | 0 refills | Status: AC | PRN

## 2019-12-14 MED ORDER — FENTANYL CITRATE (PF) 100 MCG/2ML IJ SOLN
50.0000 ug | Freq: Once | INTRAMUSCULAR | Status: AC
  Administered 2019-12-14: 50 ug via INTRAVENOUS
  Filled 2019-12-14: qty 2

## 2019-12-14 MED ORDER — HYDROCODONE-ACETAMINOPHEN 5-325 MG PO TABS: 1.0000 | ORAL_TABLET | Freq: Four times a day (QID) | ORAL | 0 refills | Status: AC | PRN

## 2019-12-14 MED ORDER — SODIUM CHLORIDE 0.9 % IV BOLUS
500.0000 mL | Freq: Once | INTRAVENOUS | Status: AC
  Administered 2019-12-14: 500 mL via INTRAVENOUS

## 2019-12-14 NOTE — ED Provider Notes (Signed)
[facility] EMERGENCY DEPARTMENT Provider Note   CSN: [id number] Arrival date & time: 12/14/19  1033     History Chief Complaint  Patient presents with  . Abdominal Pain    Bruce Duarte is a 84 y.o. male with a history of hypertension, hyperlipidemia, T2DM, iron deficiency anemia, prostate cancer in remission S/p radioactive seed implant, & prior abdominal surgeries including appendectomy who presents to the ED with complaints of progressively worsening abdominal pain over the past 2 to 3 months.  Patient believes the pain began when he strained to help pick his wife up and thinks he might of pulled a muscle.  He has had fairly constant progressively worsening pain to the lower abdomen into the groin that is worse on the right side since this time.  He states the pain feels like a pressure and like gas, it is without alleviating or aggravating factors.  He reports some associated decreased appetite, approximately 20 pound unintentional weight loss, and dark stools.  Given the worsening discomfort he decided to come to the emergency department.  He denies fever, chills, nausea, vomiting, diarrhea, bright red blood per rectum, night sweats, chest pain, or dyspnea.  He is unsure when his last colonoscopy was, he does not know if he follows with a GI doctor.  He takes aspirin, denies blood thinner or NSAID use, also denies alcohol use.  HPI     Past Medical History:  Diagnosis Date  . Arthritis   . At risk for sleep apnea    STOP-BANG= 5   SENT TO PCP 01-22-2014  . Borderline type 2 diabetes mellitus   . Hyperlipidemia   . Hypertension   . IDA (iron deficiency anemia) 07/27/2016  . Malignant neoplasm of prostate ([place]) 12/04/2013  . Prostate cancer ([place]) DX  2013   Gleason 3+3=6, vol 42.34 cc  . Wears glasses     Patient Active Problem List   Diagnosis Date Noted  . Benign prostatic hyperplasia with urinary obstruction 10/08/2019  . Weak urinary stream 10/08/2019  . IDA (iron  deficiency anemia) 07/27/2016  . History of colonic polyps 04/07/2016  . Prostate cancer ([place]) 12/04/2013    Past Surgical History:  Procedure Laterality Date  . APPENDECTOMY  age 5  . COLONOSCOPY N/A 05/05/2016   Procedure: COLONOSCOPY;  Surgeon: Daneil Dolin, MD;  Location: [facility];  Service: Endoscopy;  Laterality: N/A;  815  . COLONOSCOPY  01/2015   Dr. Carol Ada: hyperplastic polyps. H/O tubulovillous adenoma as well.   . COLONOSCOPY W/ BIOPSIES  2009   non-cancerous  . ESOPHAGOGASTRODUODENOSCOPY N/A 05/05/2016   Procedure: ESOPHAGOGASTRODUODENOSCOPY (EGD);  Surgeon: Daneil Dolin, MD;  Location: [facility];  Service: Endoscopy;  Laterality: N/A;  . GIVENS CAPSULE STUDY N/A 09/06/2016   Procedure: GIVENS CAPSULE STUDY;  Surgeon: Daneil Dolin, MD;  Location: [facility];  Service: Endoscopy;  Laterality: N/A;  7:30am (patient will arrive at 7:00am)  . PROSTATE BIOPSY  11/05/13   adenocarcinoma, gleason 6  . RADIOACTIVE SEED IMPLANT N/A 01/29/2014   Procedure: RADIOACTIVE SEED IMPLANT;  Surgeon: Arvil Persons, MD;  Location: [facility];  Service: Urology;  Laterality: N/A;     seeds implanted no seeds found in bladder       Family History  Problem Relation Age of Onset  . Diabetes Mother   . Stroke Father   . Cancer Cousin        prostate  . Colon cancer Neg Hx  Social History   Tobacco Use  . Smoking status: Former Smoker    Packs/day: 1.00    Years: 25.00    Pack years: 25.00    Types: Cigarettes, Pipe, Cigars    Quit date: 12/04/1973    Years since quitting: 46.0  . Smokeless tobacco: Never Used  Vaping Use  . Vaping Use: Never used  Substance Use Topics  . Alcohol use: Yes    Comment: occasionally brandy  . Drug use: No    Home Medications Prior to Admission medications   Medication Sig Start Date End Date Taking? Authorizing Provider  aspirin EC 81 MG tablet Take 81 mg by mouth daily.    [provider]  docusate  sodium (COLACE) 100 MG capsule Take 200 mg by mouth daily.    [provider]  dorzolamide-timolol (COSOPT) 22.3-6.8 MG/ML ophthalmic solution INSTILL 1 DROP INTO EACH EYE TWICE DAILY 08/21/18   [provider]  latanoprost (XALATAN) 0.005 % ophthalmic solution INSTILL 1 DROP INTO EACH EYE AT BEDTIME 08/21/18   [provider]  losartan (COZAAR) 100 MG tablet Take 100 mg by mouth daily. 11/11/17   [provider]  losartan-hydrochlorothiazide (HYZAAR) 100-25 MG tablet Take 1 tablet by mouth daily. 09/20/19   [provider]  LUMIGAN 0.01 % SOLN INSTILL 1 DROP INTO EACH EYE AT BEDTIME 04/17/18   [provider]  metFORMIN (GLUCOPHAGE) 500 MG tablet Take 2 tablets by mouth daily. 03/06/16   [provider]  Misc Natural Products (GINSENG COMPLEX PO) Take 1 capsule by mouth daily.     [provider]  Multiple Vitamins-Minerals (MULTIVITAMIN GUMMIES MENS PO) Take 1 tablet by mouth daily.    [provider]  sildenafil (REVATIO) 20 MG tablet TAKE THREE TABLETS BY MOUTH AS NEEDED 05/18/17   [provider]  tamsulosin (FLOMAX) 0.4 MG CAPS capsule Take 1 capsule (0.4 mg total) by mouth daily after supper. 10/08/19   McKenzie, Candee Furbish, MD    Allergies    Patient has no known allergies.  Review of Systems   Review of Systems  Constitutional: Positive for appetite change and unexpected weight change. Negative for chills and fever.  Respiratory: Negative for shortness of breath.   Cardiovascular: Negative for chest pain.  Gastrointestinal: Positive for abdominal pain. Negative for diarrhea, nausea and vomiting.       Positive for dark stool.   Genitourinary: Positive for testicular pain. Negative for dysuria and scrotal swelling.  Neurological: Negative for syncope.  All other systems reviewed and are negative.   Physical Exam Updated Vital Signs BP 97/65 (BP Location: Right Arm)   Pulse (!) 111   Temp 98.7 F  (37.1 C) (Oral)   Resp 18   Ht 5\' 9"  (1.753 m)   Wt 99.8 kg   SpO2 98%   BMI 32.49 kg/m   Physical Exam Vitals and nursing note reviewed. Exam conducted with a chaperone present.  Constitutional:      General: He is not in acute distress.    Appearance: He is well-developed. He is not toxic-appearing.  HENT:     Head: Normocephalic and atraumatic.  Eyes:     General:        Right eye: No discharge.        Left eye: No discharge.     Conjunctiva/sclera: Conjunctivae normal.  Cardiovascular:     Rate and Rhythm: Regular rhythm. Tachycardia present.  Pulmonary:     Effort: Pulmonary effort is normal. No  respiratory distress.     Breath sounds: Normal breath sounds. No wheezing, rhonchi or rales.  Abdominal:     General: Bowel sounds are normal.     Palpations: Abdomen is soft.     Tenderness: There is generalized abdominal tenderness (worse in lower abdomen). There is no guarding or rebound.  Genitourinary:    Penis: Uncircumcised. No phimosis, paraphimosis, erythema, discharge or swelling.      Testes:        Right: Mass or tenderness not present.        Left: Mass or tenderness not present.     Epididymis:     Right: No tenderness.     Left: No tenderness.     Comments: RN Eboni present as chaperone.  No obvious hernia.  DRE with brown stool with slight maroon tinge. Not melanous. No bright red blood. Fecal occult positive.  Musculoskeletal:     Cervical back: Neck supple.  Skin:    General: Skin is warm and dry.     Findings: No rash.  Neurological:     Mental Status: He is alert.     Comments: Clear speech.   Psychiatric:        Behavior: Behavior normal.     ED Results / Procedures / Treatments   Labs (all labs ordered are listed, but only abnormal results are displayed) Labs Reviewed  LIPASE, BLOOD - Abnormal; Notable for the following components:      Result Value   Lipase 113 (*)    All other components within normal limits  COMPREHENSIVE METABOLIC  PANEL - Abnormal; Notable for the following components:   Sodium 133 (*)    Chloride 93 (*)    Glucose, Bld 100 (*)    BUN 31 (*)    Creatinine, Ser 1.67 (*)    GFR calc non Af Amer 38 (*)    GFR calc Af Amer 44 (*)    All other components within normal limits  CBC - Abnormal; Notable for the following components:   RBC 3.92 (*)    Hemoglobin 10.1 (*)    HCT 32.4 (*)    MCH 25.8 (*)    RDW 18.1 (*)    All other components within normal limits  URINALYSIS, ROUTINE W REFLEX MICROSCOPIC - Abnormal; Notable for the following components:   Protein, ur 30 (*)    All other components within normal limits  POC OCCULT BLOOD, ED - Abnormal; Notable for the following components:   Fecal Occult Bld POSITIVE (*)    All other components within normal limits  OCCULT BLOOD X 1 CARD TO LAB, STOOL    EKG None  Radiology CT Abdomen Pelvis W Contrast  Result Date: 12/14/2019 CLINICAL DATA:  Unspecified abdominal pain, intermittent melena EXAM: CT ABDOMEN AND PELVIS WITH CONTRAST TECHNIQUE: Multidetector CT imaging of the abdomen and pelvis was performed using the standard protocol following bolus administration of intravenous contrast. CONTRAST:  88mL OMNIPAQUE IOHEXOL 300 MG/ML  SOLN COMPARISON:  None. FINDINGS: Lower chest: The visualized lung bases are clear bilaterally. The visualized heart and pericardium are unremarkable. Hepatobiliary: The gallbladder wall is markedly thickened, lobulated, and demonstrates invasion into the adjacent hepatic parenchyma in keeping with a primary gallbladder carcinoma. This primary mass measures roughly 8.3 x 4.5 x 3.4 cm on axial image # 25/3 and coronal image # 38/7. There is a small intrahepatic metastasis within segment 5 noted posteriorly. Additionally, there is either a serosal metastasis or direct invasion into the adjacent  colonic mesentery with a a malignant mass involving the hepatic flexure of the colon, best seen on coronal image # 32/7. There is  peritoneal carcinomatosis noted as well as numerous peritoneal implants noted within the pelvis likely involving the serosa of the sigmoid colon, the bladder dome, and several loops of small bowel in this region. Small volume ascites. No intra or extrahepatic biliary ductal dilation. Pancreas: Unremarkable Spleen: Unremarkable Adrenals/Urinary Tract: The adrenal glands and kidneys are unremarkable. The bladder is decompressed with several peritoneal implants involving the dome of the bladder, better seen on coronal imaging. Stomach/Bowel: Widespread peritoneal carcinomatosis is noted, as described above, with several serosal implants involving the colon both within the hepatic flexure as well as the sigmoid colon. There is, however, no evidence of obstruction. No free intraperitoneal gas. Underlying moderate sigmoid diverticulosis noted. Appendix normal. Vascular/Lymphatic: Pathologic retroperitoneal adenopathy is seen within the left periaortic and aortocaval lymph node groups. The abdominal vasculature is age-appropriate with moderate aortoiliac atherosclerotic calcification. Reproductive: Brachytherapy seeds are seen within the prostate gland. Other: The rectum is unremarkable. Musculoskeletal: No lytic or blastic bone lesions. 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 Peritoneal implants also involve multiple loops of small bowel as well as the dome of the bladder. No evidence of bowel obstruction at this time. Electronically Signed   By: Fidela Salisbury MD   On: 12/14/2019 19:01    Procedures Procedures (including critical care time)  Medications Ordered in ED Medications - No data to display  ED Course  I have reviewed the triage vital signs and the nursing notes.  Pertinent labs & imaging results that were available  during my care of the patient were reviewed by me and considered in my medical decision making (see chart for details).    MDM Rules/Calculators/A&P                         Patient presents to the ED with complaints of progressively worsening abdominal pain for the past few months.  Patient is nontoxic, resting comfortably, mild tachycardia noted.  His abdomen has generalized tenderness that is worse in the lower quadrants without peritoneal signs.  Ddx: Cancerous process/mass, perforation, obstruction, colitis, diverticulitis, diverticulosis, GI bleed, constipation, UTI.  No obvious hernia palpated, no testicular tenderness or obvious torsion.  Additional history obtained:  Additional history obtained from chart & nursing note review. Previous records obtained and reviewed:  05/05/2016:  Upper endoscopy:  - Normal esophagus. - Erosive gastropathy. Biopsied.  - ANTRAL MUCOSA WITH SLIGHT CHRONIC INFLAMMATION, HYPEREMIA AND FOCAL   EROSION.  [place] STAIN NEGATIVE FOR HELICOBACTER PYLORI.  - NO INTESTINAL METAPLASIA, DYSPLASIA OR MALIGNANCY - Normal duodenal bulb and second portion of the duodenum. Colonoscopy:  - One 5 mm polyp at the splenic flexure, removed with a cold snare. Resected and Retrieved.---> bx with tubular adenoma. No high grade dysplasia or malignancy identified.  - Diverticulosis in the sigmoid colon and in the descending colon. - The examination was otherwise normal on direct and retroflexion views.  09/06/2016:  Capsule study  Capsule study complete to the cecum. Known erosions in stomach.  No overt protruding lesions noted. Approximately midway through  small bowel at 02:18:23, possible submucosal raised area of small  bowel vs extrinsic compression vs normal small bowel peristalsis,  no concerning features at site of interest. Scattered few  lymphangiectasis in proximal small bowel. Scattered erosions  without obvious ulcerations in small bowel  Lab  Tests:  I reviewed and interpreted labs, which included:  CBC: No significant leukocytosis or platelet dysfunction.  Anemia which is new from most recent labs, hgb 10.1 and hct 32.4 today most recently 13.2 & 42.8 respectively.  Fecal occult : Positive,  CMP: Mild AKI with creatinine 1.67 and BUN 31, most recently 0.94 and 14.  Mild hyponatremia/hypochloremia.  LFTs are within normal limits Lipase: Elevated at 113 Urinalysis: no UTI Following initial assessment Fentanyl ordered for pain, 500 ccs of fluids for mild AKI.   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 Peritoneal implants also involve multiple loops of small bowel as well as the dome of the bladder. No evidence of bowel obstruction at this time  Patient & his daughter updated on results thus far, patient would wish to have treatment if this is an option.   Discussed with hospitalist Dr. Nehemiah Settle for admission given widespread cancer and mild AKI, however relays no oncology in house this weekend, recommends holding cozaar, metformin & losartan with discharge which seems reasonable at this time, he was given fluids in the ED for mild AKI.   20:11: CONSULT: Discussed with oncologist Dr. Delton Coombes- will have office call patient Monday AM to facilitate follow up.   Will stop medications as discussed with hospitalist service & prescribe norco & zofran for symptomatic care, discussed may need miralax prn with the norco. I discussed results, treatment plan, need for follow-up, and return precautions with the patient & his daughter. Provided opportunity for questions, patient & his daughter confirmed understanding and are in agreement with  plan.   Findings and plan of care discussed with supervising physician Dr. Alvino Chapel who is in agreement.   Portions of this note were generated with Lobbyist. Dictation errors may occur despite best attempts at proofreading.  Final Clinical Impression(s) / ED Diagnoses Final diagnoses:  Gallbladder carcinoma ([place])  Metastatic malignant neoplasm, unspecified site ([place])  AKI (acute kidney injury) ([place])  Anemia, unspecified type    Rx / DC Orders ED Discharge Orders         Ordered    HYDROcodone-acetaminophen (NORCO/VICODIN) 5-325 MG tablet  Every 6 hours PRN     Discontinue  Reprint     12/14/19 2046    ondansetron (ZOFRAN ODT) 4 MG disintegrating tablet  Every 8 hours PRN     Discontinue  Reprint     12/14/19 2046           Zanobia Griebel, Glynda Jaeger, PA-C 12/14/19 2049    Davonna Belling, MD 12/14/19 2345

## 2019-12-14 NOTE — ED Triage Notes (Signed)
Pt reports abd pain x2 months. Pt reports intermittent n/diarrhea, black stools. Pt reports pain and bloating began after pulling muscle in "belly" a few months ago.

## 2019-12-14 NOTE — ED Notes (Signed)
Unsuccessful IV stick x2. Will consult another nurse

## 2019-12-14 NOTE — Discharge Instructions (Addendum)
You were seen in the emergency department today for abdominal pain.  Unfortunately your CT scan shows cancer of the gallbladder which has spread to your small and large intestines as well as your abdominal wall.  Your labs show that you are somewhat dehydrated and mildly anemic.  We discussed with the oncologist on-call, Dr. Delton Coombes, their office will call you on Monday to help facilitate follow-up.  Please follow-up with your primary care provider as scheduled on Tuesday.  In the meantime we would like you to stop your Metformin, Cozaar, and losartan medications  We are sending you home with the following medicines to help with your symptoms: -Zofran: Take every 8 hours as needed for nausea and vomiting - Norco-this is a narcotic/controlled substance medication that has potential addicting qualities.  We recommend that you take 1 tablet every 6 hours as needed for severe pain.  Do not drive or operate heavy machinery when taking this medicine as it can be sedating. Do not drink alcohol or take other sedating medications when taking this medicine for safety reasons.  Keep this out of reach of small children.  Please be aware this medicine has Tylenol in it (325 mg/tab) do not exceed the maximum dose of Tylenol in a day per over the counter recommendations should you decide to supplement with Tylenol over the counter.   This medication Norco can cause constipation issues, you may take MiraLAX over-the-counter as needed.  Follow-up as discussed above.  Return to the ER for any new or worsening symptoms including but not limited to increased pain, inability to have a bowel movement, bloody bowel movements, inability to keep fluids down, dizziness, lightheadedness, passing out, or any other concerns at all.

## 2019-12-17 ENCOUNTER — Encounter (HOSPITAL_COMMUNITY): Payer: Self-pay

## 2019-12-17 ENCOUNTER — Ambulatory Visit (HOSPITAL_COMMUNITY): Payer: Medicare Other

## 2019-12-18 DIAGNOSIS — E1169 Type 2 diabetes mellitus with other specified complication: Secondary | ICD-10-CM | POA: Diagnosis not present

## 2019-12-18 DIAGNOSIS — C23 Malignant neoplasm of gallbladder: Secondary | ICD-10-CM | POA: Diagnosis not present

## 2019-12-18 DIAGNOSIS — I10 Essential (primary) hypertension: Secondary | ICD-10-CM | POA: Diagnosis not present

## 2019-12-19 ENCOUNTER — Encounter (HOSPITAL_COMMUNITY): Payer: Self-pay

## 2019-12-19 NOTE — Progress Notes (Signed)
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 male is here because of metastatic gallbladder carcinoma, at the request of Dr. Davonna Belling at [facility]. He came to [facility] on 12/14/2019 for progressively worsening abdominal pain and intermittent melena over the past 2-3 months. 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  Today he is accompanied by his daughter. He complains of abdominal pain in the bottom right quadrant since pulling his abdominal muscle trying to pick up his wife on 08/08/2019. He denies hematochezia, but he still sees melena occasionally since 07/2019. He has lost 40 lbs since the beginning of the year and is feeling fatigued. He reports having an appetite, but he has not been eating because he feels bloated and the taste of food is not good. Once he passes gas, his abdominal pain subsides. He has loose BM's since taking milk of magnesia for constipation. He had an appendectomy at the age of 27.  He lives at home with his wife who has Alzheimer's. He was doing all of his chores prior to pulling his abdominal muscle. He used to work as a Administrator for 52 years. He smoked cigars and cigarettes, but quit about 35 years ago. His mother had brain tumor; brother had melanoma. His last colonoscopy was on 05/05/2016.   MEDICAL HISTORY:  Past Medical History:  Diagnosis Date  . Arthritis   . At risk for sleep apnea    STOP-BANG=  5   SENT TO PCP 01-22-2014  . Borderline type 2 diabetes mellitus   . Hyperlipidemia   . Hypertension   . IDA (iron deficiency anemia) 07/27/2016  . Malignant neoplasm of prostate ([place] [facility]) 12/04/2013  . Prostate cancer ([facility]) DX  2013   Gleason 3+3=6, vol 42.34 cc  . Wears glasses     SURGICAL HISTORY: Past Surgical History:  Procedure Laterality Date  . APPENDECTOMY  age 79  . COLONOSCOPY N/A 05/05/2016   Procedure: COLONOSCOPY;  Surgeon: Daneil Dolin, MD;  [facility];  Service: Endoscopy;  Laterality: N/A;  815  . COLONOSCOPY  01/2015   Dr. Carol Ada: hyperplastic polyps. H/O tubulovillous adenoma as well.   . COLONOSCOPY W/ BIOPSIES  2009   non-cancerous  . ESOPHAGOGASTRODUODENOSCOPY N/A 05/05/2016   Procedure: ESOPHAGOGASTRODUODENOSCOPY (EGD);  Surgeon: Daneil Dolin, MD;  [facility];  Service: Endoscopy;  Laterality: N/A;  . GIVENS CAPSULE STUDY N/A 09/06/2016   Procedure: GIVENS CAPSULE STUDY;  Surgeon: Daneil Dolin, MD;  [facility];  Service: Endoscopy;  Laterality: N/A;  7:30am (patient will arrive at 7:00am)  . PROSTATE BIOPSY  11/05/13   adenocarcinoma, gleason 6  . RADIOACTIVE SEED IMPLANT N/A 01/29/2014   Procedure: RADIOACTIVE SEED IMPLANT;  Surgeon: Arvil Persons, MD;  Location: [facility];  Service: Urology;  Laterality: N/A;     seeds implanted no seeds found in bladder    SOCIAL HISTORY: Social History   Socioeconomic History  . Marital status: Married    Spouse name:  Not on file  . Number of children: Not on file  . Years of education: Not on file  . Highest education level: Not on file  Occupational History  . Not on file  Tobacco Use  . Smoking status: Former Smoker    Packs/day: 1.00    Years: 25.00    Pack years: 25.00    Types: Cigars    Quit date: 12/04/1973    Years since quitting: 46.0  . Smokeless tobacco: Never Used  Vaping Use  . Vaping Use: Never used  Substance and Sexual Activity  .  Alcohol use: Yes    Comment: occasionally brandy  . Drug use: No  . Sexual activity: Not Currently  Other Topics Concern  . Not on file  Social History Narrative  . Not on file   Social Determinants of Health   Financial Resource Strain:   . Difficulty of Paying Living Expenses:   Food Insecurity:   . Worried About Charity fundraiser in the Last Year:   . Arboriculturist in the Last Year:   Transportation Needs:   . Film/video editor (Medical):   Marland Kitchen Lack of Transportation (Non-Medical):   Physical Activity:   . Days of Exercise per Week:   . Minutes of Exercise per Session:   Stress:   . Feeling of Stress :   Social Connections:   . Frequency of Communication with Friends and Family:   . Frequency of Social Gatherings with Friends and Family:   . Attends Religious Services:   . Active Member of Clubs or Organizations:   . Attends Archivist Meetings:   Marland Kitchen Marital Status:   Intimate Partner Violence:   . Fear of Current or Ex-Partner:   . Emotionally Abused:   Marland Kitchen Physically Abused:   . Sexually Abused:     FAMILY HISTORY: Family History  Problem Relation Age of Onset  . Diabetes Mother   . Cancer Mother        brain tumor  . Stroke Father   . Melanoma Brother   . Mental illness Sister   . Colon cancer Neg Hx     ALLERGIES:  has No Known Allergies.  MEDICATIONS:  Current Outpatient Medications  Medication Sig Dispense Refill  . aspirin EC 81 MG tablet Take 81 mg by mouth daily.    Marland Kitchen docusate sodium (COLACE) 100 MG capsule Take 200 mg by mouth daily.    . dorzolamide-timolol (COSOPT) 22.3-6.8 MG/ML ophthalmic solution Place 1 drop into both eyes 2 (two) times daily.     Marland Kitchen GINSENG PO Take 1 tablet by mouth daily.    Marland Kitchen HYDROcodone-acetaminophen (NORCO/VICODIN) 5-325 MG tablet Take 1 tablet by mouth every 6 (six) hours as needed. 12 tablet 0  . latanoprost (XALATAN) 0.005 % ophthalmic solution Place 1 drop into both eyes at bedtime.     Marland Kitchen losartan  (COZAAR) 100 MG tablet Take 100 mg by mouth daily.  1  . Magnesium Hydroxide (MILK OF MAGNESIA PO) Take by mouth. Taking for constipation.    . metFORMIN (GLUCOPHAGE) 500 MG tablet Take 2 tablets by mouth daily.    . Multiple Vitamins-Minerals (MULTIVITAMIN GUMMIES MENS PO) Take 1 tablet by mouth daily.    . tamsulosin (FLOMAX) 0.4 MG CAPS capsule TAKE ONE CAPSULE BY MOUTH EVERY DAY AT BEDTIME 90 capsule 1  . UNABLE TO FIND Med Name: sea moss.  Patient makes into a gel and uses as a second multi vitamin source.    Marland Kitchen  ondansetron (ZOFRAN ODT) 4 MG disintegrating tablet Take 1 tablet (4 mg total) by mouth every 8 (eight) hours as needed for nausea or vomiting. (Patient not taking: Reported on 12/20/2019) 5 tablet 0   No current facility-administered medications for this visit.    REVIEW OF SYSTEMS:   Review of Systems  Constitutional: Positive for appetite change (moderately decreased) and fatigue (moderate).  Gastrointestinal: Positive for abdominal distention, abdominal pain (3/10 abdominal pain) and diarrhea (loose stool).  All other systems reviewed and are negative.    PHYSICAL EXAMINATION: ECOG PERFORMANCE STATUS: 1 - Symptomatic but completely ambulatory  Vitals:   12/20/19 0814  BP: (!) 121/55  Pulse: (!) 103  Resp: 16  SpO2: 100%   Filed Weights   12/20/19 0814  Weight: 182 lb 11.2 oz (82.9 kg)   Physical Exam Vitals reviewed.  Constitutional:      Appearance: Normal appearance.  Cardiovascular:     Rate and Rhythm: Normal rate and regular rhythm.     Pulses: Normal pulses.     Heart sounds: Normal heart sounds.  Pulmonary:     Effort: Pulmonary effort is normal.     Breath sounds: Normal breath sounds.  Abdominal:     General: There is no distension.     Palpations: Abdomen is soft. There is no mass.     Tenderness: There is abdominal tenderness in the right lower quadrant.     Hernia: No hernia is present.  Lymphadenopathy:     Upper Body:     Right upper  body: No supraclavicular adenopathy.     Left upper body: No supraclavicular adenopathy.  Neurological:     General: No focal deficit present.     Mental Status: He is alert and oriented to person, place, and time.  Psychiatric:        Mood and Affect: Mood normal.        Behavior: Behavior normal.      LABORATORY DATA:  I have reviewed the data as listed CBC Latest Ref Rng & Units 12/20/2019 12/14/2019 12/25/2018  WBC 4.0 - 10.5 K/uL 6.4 4.4 4.4  Hemoglobin 13.0 - 17.0 g/dL 9.6(L) 10.1(L) 13.4  Hematocrit 39 - 52 % 31.4(L) 32.4(L) 42.8  Platelets 150 - 400 K/uL 348 319 204   CMP Latest Ref Rng & Units 12/20/2019 12/14/2019 12/25/2018  Glucose 70 - 99 mg/dL 130(H) 100(H) 123(H)  BUN 8 - 23 mg/dL 21 31(H) 13  Creatinine 0.61 - 1.24 mg/dL 1.29(H) 1.67(H) 0.94  Sodium 135 - 145 mmol/L 136 133(L) 139  Potassium 3.5 - 5.1 mmol/L 3.7 3.9 3.6  Chloride 98 - 111 mmol/L 95(L) 93(L) 104  CO2 22 - 32 mmol/L 28 28 27   Calcium 8.9 - 10.3 mg/dL 9.2 9.5 8.9  Total Protein 6.5 - 8.1 g/dL 7.3 7.3 7.1  Total Bilirubin 0.3 - 1.2 mg/dL 0.4 0.6 0.4  Alkaline Phos 38 - 126 U/L 65 68 50  AST 15 - 41 U/L 24 20 20   ALT 0 - 44 U/L 22 18 19    Lab Results  Component Value Date   PSA <0.1 10/01/2019     RADIOGRAPHIC STUDIES: I have personally reviewed the radiological images as listed and agreed with the findings in the report. CT Abdomen Pelvis W Contrast  Result Date: 12/14/2019 CLINICAL DATA:  Unspecified abdominal pain, intermittent melena EXAM: CT ABDOMEN AND PELVIS WITH CONTRAST TECHNIQUE: Multidetector CT imaging of the abdomen and pelvis was performed using the standard protocol following bolus administration of  intravenous contrast. CONTRAST:  58mL OMNIPAQUE IOHEXOL 300 MG/ML  SOLN COMPARISON:  None. FINDINGS: Lower chest: The visualized lung bases are clear bilaterally. The visualized heart and pericardium are unremarkable. Hepatobiliary: The gallbladder wall is markedly thickened, lobulated, and  demonstrates invasion into the adjacent hepatic parenchyma in keeping with a primary gallbladder carcinoma. This primary mass measures roughly 8.3 x 4.5 x 3.4 cm on axial image # 25/3 and coronal image # 38/7. There is a small intrahepatic metastasis within segment 5 noted posteriorly. Additionally, there is either a serosal metastasis or direct invasion into the adjacent colonic mesentery with a a malignant mass involving the hepatic flexure of the colon, best seen on coronal image # 32/7. There is peritoneal carcinomatosis noted as well as numerous peritoneal implants noted within the pelvis likely involving the serosa of the sigmoid colon, the bladder dome, and several loops of small bowel in this region. Small volume ascites. No intra or extrahepatic biliary ductal dilation. Pancreas: Unremarkable Spleen: Unremarkable Adrenals/Urinary Tract: The adrenal glands and kidneys are unremarkable. The bladder is decompressed with several peritoneal implants involving the dome of the bladder, better seen on coronal imaging. Stomach/Bowel: Widespread peritoneal carcinomatosis is noted, as described above, with several serosal implants involving the colon both within the hepatic flexure as well as the sigmoid colon. There is, however, no evidence of obstruction. No free intraperitoneal gas. Underlying moderate sigmoid diverticulosis noted. Appendix normal. Vascular/Lymphatic: Pathologic retroperitoneal adenopathy is seen within the left periaortic and aortocaval lymph node groups. The abdominal vasculature is age-appropriate with moderate aortoiliac atherosclerotic calcification. Reproductive: Brachytherapy seeds are seen within the prostate gland. Other: The rectum is unremarkable. Musculoskeletal: No lytic or blastic bone lesions. IMPRESSION: Metastatic gallbladder carcinoma with widespread peritoneal carcinomatosis and multiple serosal implants within the colon, likely accounting for the patient's melena, involving the  hepatic flexure and sigmoid colon. Peritoneal implants also involve multiple loops of small bowel as well as the dome of the bladder. No evidence of bowel obstruction at this time. Electronically Signed   By: Fidela Salisbury MD   On: 12/14/2019 19:01    ASSESSMENT:  1.  Possible metastatic gallbladder cancer with peritoneal carcinomatosis: -On and off lower abdominal pain since March 2021 -CT AP on 12/14/2019 shows mass in the gallbladder fossa 8.3 x 4.5 x 3.4 cm with 1 small intrahepatic meta stasis.  There is peritoneal carcinomatosis with numerous peritoneal implants within the pelvis involving the serosa of sigmoid colon, bladder dome and several loops of small bowel.  Serosal metastasis or direct invasion into adjacent colonic mesentery with a malignant mass involving hepatic flexure of the colon. -Colonoscopy on 05/05/2016 shows polyp at the splenic flexure, diverticulosis in the sigmoid and in the descending colon. -EGD on 05/05/2016 shows normal esophagus, there is a gastropathy and normal duodenal bulb. -40 pound weight loss in the last 4 months, decreased eating secondary to bloating and gas and loss of taste. -Also has dark stools.  Stool for occult blood in the ER was positive.  2.  Prostate cancer: -He underwent seed implants 3 to 4 years ago.  PSA on 10/01/2019 was <0.1.  3.  Iron deficiency anemia: -Previous history of gastric erosions, diverticulosis and polyps.  He was receiving intermittent Feraheme infusions.  4.  Family history: -Mother had brain cancer and brother had melanoma.    PLAN:  1.  Likely gallbladder carcinoma with peritoneal carcinomatosis: -I have reviewed images of the CT scan and reports with the patient and his daughter in detail. -I  have recommended tumor markers CEA and CA 19-9.  We will also order CT scan of the chest to complete work-up. -I have recommended CT-guided biopsy for diagnosis.  We will discuss treatment plan after the biopsy.  2.  Iron  deficiency anemia: -His latest hemoglobin dropped down to 10.1 from 13.4 previously. -We will obtain baseline ferritin levels.  I have recommended Feraheme weekly x2.   All questions were answered. The patient knows to call the clinic with any problems, questions or concerns.   Derek Jack, MD, 12/20/19 7:12 PM  [facility] [phone]   I, Milinda Antis, am acting as a scribe for Dr. Sanda Linger.  I, Derek Jack MD, have reviewed the above documentation for accuracy and completeness, and I agree with the above.

## 2019-12-19 NOTE — Progress Notes (Signed)
I spoke with the patient today via telephone. This was our initial introductory phone call. I introduced myself and explained my role in the patient's care. I provided information to the patient about his visit tomorrow. He was given the opprotunity to ask questions. Patient expressed no concerns or barriers to his care at this time. I provided my contact information and advised the patient that I will see him at his visit tomorrow.

## 2019-12-20 ENCOUNTER — Encounter (HOSPITAL_COMMUNITY): Payer: Self-pay | Admitting: Hematology

## 2019-12-20 ENCOUNTER — Other Ambulatory Visit: Payer: Self-pay

## 2019-12-20 ENCOUNTER — Inpatient Hospital Stay (HOSPITAL_COMMUNITY): Payer: Medicare Other

## 2019-12-20 ENCOUNTER — Inpatient Hospital Stay (HOSPITAL_COMMUNITY): Payer: Medicare Other | Attending: Hematology | Admitting: Hematology

## 2019-12-20 VITALS — BP 121/55 | HR 103 | Resp 16 | Ht 69.0 in | Wt 182.7 lb

## 2019-12-20 DIAGNOSIS — R933 Abnormal findings on diagnostic imaging of other parts of digestive tract: Secondary | ICD-10-CM | POA: Insufficient documentation

## 2019-12-20 DIAGNOSIS — Z87891 Personal history of nicotine dependence: Secondary | ICD-10-CM | POA: Diagnosis not present

## 2019-12-20 DIAGNOSIS — D500 Iron deficiency anemia secondary to blood loss (chronic): Secondary | ICD-10-CM

## 2019-12-20 DIAGNOSIS — Z8546 Personal history of malignant neoplasm of prostate: Secondary | ICD-10-CM | POA: Insufficient documentation

## 2019-12-20 DIAGNOSIS — Z8719 Personal history of other diseases of the digestive system: Secondary | ICD-10-CM | POA: Diagnosis not present

## 2019-12-20 DIAGNOSIS — R970 Elevated carcinoembryonic antigen [CEA]: Secondary | ICD-10-CM | POA: Diagnosis not present

## 2019-12-20 DIAGNOSIS — D509 Iron deficiency anemia, unspecified: Secondary | ICD-10-CM | POA: Insufficient documentation

## 2019-12-20 DIAGNOSIS — C801 Malignant (primary) neoplasm, unspecified: Secondary | ICD-10-CM | POA: Diagnosis not present

## 2019-12-20 DIAGNOSIS — Z808 Family history of malignant neoplasm of other organs or systems: Secondary | ICD-10-CM | POA: Diagnosis not present

## 2019-12-20 DIAGNOSIS — D490 Neoplasm of unspecified behavior of digestive system: Secondary | ICD-10-CM | POA: Diagnosis not present

## 2019-12-20 DIAGNOSIS — G893 Neoplasm related pain (acute) (chronic): Secondary | ICD-10-CM | POA: Insufficient documentation

## 2019-12-20 DIAGNOSIS — C786 Secondary malignant neoplasm of retroperitoneum and peritoneum: Secondary | ICD-10-CM

## 2019-12-20 DIAGNOSIS — C221 Intrahepatic bile duct carcinoma: Secondary | ICD-10-CM

## 2019-12-20 DIAGNOSIS — Z86010 Personal history of colonic polyps: Secondary | ICD-10-CM

## 2019-12-20 LAB — CBC WITH DIFFERENTIAL/PLATELET
Abs Immature Granulocytes: 0.02 10*3/uL (ref 0.00–0.07)
Basophils Absolute: 0 10*3/uL (ref 0.0–0.1)
Basophils Relative: 1 %
Eosinophils Absolute: 0 10*3/uL (ref 0.0–0.5)
Eosinophils Relative: 1 %
HCT: 31.4 % — ABNORMAL LOW (ref 39.0–52.0)
Hemoglobin: 9.6 g/dL — ABNORMAL LOW (ref 13.0–17.0)
Immature Granulocytes: 0 %
Lymphocytes Relative: 12 %
Lymphs Abs: 0.8 10*3/uL (ref 0.7–4.0)
MCH: 25.6 pg — ABNORMAL LOW (ref 26.0–34.0)
MCHC: 30.6 g/dL (ref 30.0–36.0)
MCV: 83.7 fL (ref 80.0–100.0)
Monocytes Absolute: 0.5 10*3/uL (ref 0.1–1.0)
Monocytes Relative: 7 %
Neutro Abs: 5.1 10*3/uL (ref 1.7–7.7)
Neutrophils Relative %: 79 %
Platelets: 348 10*3/uL (ref 150–400)
RBC: 3.75 MIL/uL — ABNORMAL LOW (ref 4.22–5.81)
RDW: 18.1 % — ABNORMAL HIGH (ref 11.5–15.5)
WBC: 6.4 10*3/uL (ref 4.0–10.5)
nRBC: 0 % (ref 0.0–0.2)

## 2019-12-20 LAB — COMPREHENSIVE METABOLIC PANEL
ALT: 22 U/L (ref 0–44)
AST: 24 U/L (ref 15–41)
Albumin: 4.1 g/dL (ref 3.5–5.0)
Alkaline Phosphatase: 65 U/L (ref 38–126)
Anion gap: 13 (ref 5–15)
BUN: 21 mg/dL (ref 8–23)
CO2: 28 mmol/L (ref 22–32)
Calcium: 9.2 mg/dL (ref 8.9–10.3)
Chloride: 95 mmol/L — ABNORMAL LOW (ref 98–111)
Creatinine, Ser: 1.29 mg/dL — ABNORMAL HIGH (ref 0.61–1.24)
GFR calc Af Amer: 60 mL/min — ABNORMAL LOW (ref >60)
GFR calc non Af Amer: 52 mL/min — ABNORMAL LOW (ref >60)
Glucose, Bld: 130 mg/dL — ABNORMAL HIGH (ref 70–99)
Potassium: 3.7 mmol/L (ref 3.5–5.1)
Sodium: 136 mmol/L (ref 135–145)
Total Bilirubin: 0.4 mg/dL (ref 0.3–1.2)
Total Protein: 7.3 g/dL (ref 6.5–8.1)

## 2019-12-20 LAB — IRON AND TIBC
Iron: 21 ug/dL — ABNORMAL LOW (ref 45–182)
Saturation Ratios: 6 % — ABNORMAL LOW (ref 17.9–39.5)
TIBC: 327 ug/dL (ref 250–450)
UIBC: 306 ug/dL

## 2019-12-20 LAB — LACTATE DEHYDROGENASE: LDH: 165 U/L (ref 98–192)

## 2019-12-20 LAB — FERRITIN: Ferritin: 66 ng/mL (ref 24–336)

## 2019-12-20 LAB — FOLATE: Folate: 17.3 ng/mL (ref >5.9)

## 2019-12-20 NOTE — Patient Instructions (Signed)
[facility] Discharge Instructions  You were seen today by Dr. Delton Coombes. He talked with you about your past medical history, your recent visit to the ER and what those findings were.  They found a mass in your liver in the area of your gallbladder.  We cannot definitely say what it is until we get a biopsy of the area.  You also have some areas in your abdomen around your colon.  It is not in your colon but around the lining if your bowels. He talked with you about needing to complete your workup and make sure that cancer hasn't spread outside of your abdomen. We will schedule you for a CT scan of the chest.  We will also do lab work today.  We will check your iron levels today as well to make sure you don't need another dose of iron.    We talked with you about your nutrition.  Keep up with your oral intake of whatever you want to eat.  We want you to add Glucerna shakes to your diet 2-3 times daily.  We will also get you a referral to our nutritionist.  She can help you with getting the right amount of protein and calories to maintain and even increase your weight.   We will talk with you at your next appointment about all the test results and then what the options are for treatment.    If you have any questions, please do not hesitate to call us.  Call Lilia Pro or Richie Vadala and we can help guide you through this process.      Thank you for choosing [facility] at [facility] to provide your oncology and hematology care.  To afford each patient quality time with our provider, please arrive at least 15 minutes before your scheduled appointment time.   If you have a lab appointment with the [facility] please come in thru the Main Entrance and check in at the main information desk.  You need to re-schedule your appointment should you arrive 10 or more minutes late.  We strive to give you quality time with our providers, and arriving late  affects you and other patients whose appointments are after yours.  Also, if you no show three or more times for appointments you may be dismissed from the clinic at the providers discretion.     Again, thank you for choosing [facility].  Our hope is that these requests will decrease the amount of time that you wait before being seen by our physicians.       _____________________________________________________________  Should you have questions after your visit to [facility], please contact our office at [phone] and follow the prompts.  Our office hours are 8:00 a.m. and 4:30 p.m. Monday - Friday.  Please note that voicemails left after 4:00 p.m. may not be returned until the following business day.  We are closed weekends and major holidays.  You do have access to a nurse 24-7, just call the main number to the clinic [phone] and do not press any options, hold on the line and a nurse will answer the phone.    For prescription refill requests, have your pharmacy contact our office and allow 72 hours.    Due to Covid, you will need to wear a mask upon entering the hospital. If you do not have a mask, a mask will be given to you at  the Main Entrance upon arrival. For doctor visits, patients may have 1 support person age 82 or older with them. For treatment visits, patients can not have anyone with them due to social distancing guidelines and our immunocompromised population.

## 2019-12-20 NOTE — Progress Notes (Signed)
We met with patient during the visit with Dr. Delton Duarte. He is here with his daughter, Bruce Duarte. We talked about next steps and how we can be of assistance to him. We provided our contact number and advised for him to call us if he has any questions.  Him and his daughter were given the opportunity to ask questions and at this time, all were answered to their satisfaction.

## 2019-12-21 LAB — CANCER ANTIGEN 19-9: CA 19-9: 12 U/mL (ref 0–35)

## 2019-12-21 LAB — CEA: CEA: 432 ng/mL — ABNORMAL HIGH (ref 0.0–4.7)

## 2019-12-24 ENCOUNTER — Encounter (HOSPITAL_COMMUNITY): Payer: Self-pay

## 2019-12-24 NOTE — Progress Notes (Unsigned)
Bruce Duarte Male, 84 y.o., Oct 28, 1935 MRN:  [id number] Phone:  [phone] (H) ... PCP:  [name], MD Primary Cvg:  Medicare/Medicare Part A And B Next Appt With Radiology (AP-CT 1) 12/26/2019 at 4:30 PM  RE: Biopsy Received: 2 days ago Message Details  Arne Cleveland, MD  Ernestene Mention   [place] core bx liver mass    [facility]   Previous Messages  ----- Message -----  From: Lenore Cordia  Sent: 12/21/2019  3:19 PM EDT  To: [facility] Procedure Requests  Subject: Biopsy                      Procedure Requested: CT Biopsy    Reason for Procedure: liver mass, questionable cholangiocarcinoma, hx prostate cancer    Provider Requesting: Derek Jack  Provider Telephone: [phone]    Other Info:

## 2019-12-25 ENCOUNTER — Other Ambulatory Visit (HOSPITAL_COMMUNITY): Payer: Self-pay | Admitting: Hematology

## 2019-12-25 ENCOUNTER — Ambulatory Visit (HOSPITAL_COMMUNITY): Payer: Medicare Other

## 2019-12-25 DIAGNOSIS — C221 Intrahepatic bile duct carcinoma: Secondary | ICD-10-CM

## 2019-12-26 ENCOUNTER — Encounter (HOSPITAL_COMMUNITY): Payer: Self-pay

## 2019-12-26 ENCOUNTER — Ambulatory Visit (HOSPITAL_COMMUNITY)
Admission: RE | Admit: 2019-12-26 | Discharge: 2019-12-26 | Disposition: A | Discharge status: Home / Self Care | Payer: Medicare Other | Source: Ambulatory Visit | Attending: Hematology | Admitting: Hematology

## 2019-12-26 ENCOUNTER — Other Ambulatory Visit: Payer: Self-pay

## 2019-12-26 ENCOUNTER — Inpatient Hospital Stay (HOSPITAL_COMMUNITY): Payer: Medicare Other

## 2019-12-26 VITALS — BP 130/69 | HR 86 | Temp 97.0°F | Resp 18

## 2019-12-26 DIAGNOSIS — D509 Iron deficiency anemia, unspecified: Secondary | ICD-10-CM | POA: Diagnosis not present

## 2019-12-26 DIAGNOSIS — C801 Malignant (primary) neoplasm, unspecified: Secondary | ICD-10-CM | POA: Diagnosis not present

## 2019-12-26 DIAGNOSIS — R933 Abnormal findings on diagnostic imaging of other parts of digestive tract: Secondary | ICD-10-CM | POA: Diagnosis not present

## 2019-12-26 DIAGNOSIS — I2510 Atherosclerotic heart disease of native coronary artery without angina pectoris: Secondary | ICD-10-CM | POA: Diagnosis not present

## 2019-12-26 DIAGNOSIS — I700 Atherosclerosis of aorta: Secondary | ICD-10-CM | POA: Diagnosis not present

## 2019-12-26 DIAGNOSIS — C221 Intrahepatic bile duct carcinoma: Secondary | ICD-10-CM | POA: Diagnosis not present

## 2019-12-26 DIAGNOSIS — R918 Other nonspecific abnormal finding of lung field: Secondary | ICD-10-CM | POA: Diagnosis not present

## 2019-12-26 DIAGNOSIS — J479 Bronchiectasis, uncomplicated: Secondary | ICD-10-CM | POA: Diagnosis not present

## 2019-12-26 DIAGNOSIS — D490 Neoplasm of unspecified behavior of digestive system: Secondary | ICD-10-CM | POA: Diagnosis not present

## 2019-12-26 DIAGNOSIS — D500 Iron deficiency anemia secondary to blood loss (chronic): Secondary | ICD-10-CM

## 2019-12-26 DIAGNOSIS — G893 Neoplasm related pain (acute) (chronic): Secondary | ICD-10-CM | POA: Diagnosis not present

## 2019-12-26 DIAGNOSIS — C786 Secondary malignant neoplasm of retroperitoneum and peritoneum: Secondary | ICD-10-CM | POA: Diagnosis not present

## 2019-12-26 MED ORDER — SODIUM CHLORIDE 0.9 % IV SOLN: Freq: Once | INTRAVENOUS | Status: AC

## 2019-12-26 MED ORDER — IOHEXOL 300 MG/ML  SOLN
75.0000 mL | Freq: Once | INTRAMUSCULAR | Status: AC | PRN
  Administered 2019-12-26: 75 mL via INTRAVENOUS

## 2019-12-26 MED ORDER — SODIUM CHLORIDE 0.9 % IV SOLN
510.0000 mg | Freq: Once | INTRAVENOUS | Status: AC
  Administered 2019-12-26: 510 mg via INTRAVENOUS
  Filled 2019-12-26: qty 17

## 2019-12-26 NOTE — Patient Instructions (Signed)
[facility]  Discharge Instructions:   _______________________________________________________________  Thank you for choosing [facility] at [facility] to provide your oncology and hematology care.  To afford each patient quality time with our providers, please arrive at least 15 minutes before your scheduled appointment.  You need to re-schedule your appointment if you arrive 10 or more minutes late.  We strive to give you quality time with our providers, and arriving late affects you and other patients whose appointments are after yours.  Also, if you no show three or more times for appointments you may be dismissed from the clinic.  Again, thank you for choosing [facility] at [facility]. Our hope is that these requests will allow you access to exceptional care and in a timely manner. _______________________________________________________________  If you have questions after your visit, please contact our office at [phone] between the hours of 8:30 a.m. and 5:00 p.m. Voicemails left after 4:30 p.m. will not be returned until the following business day. _______________________________________________________________  For prescription refill requests, have your pharmacy contact our office. _______________________________________________________________  Recommendations made by the consultant and any test results will be sent to your referring physician. _______________________________________________________________ 

## 2019-12-26 NOTE — Progress Notes (Signed)
Patient presents today for Feraheme infusion. Vital signs stable. Patient denies pain today. MAR reviewed and updated. Patient has no complaints of any changes since his last visit.   Feraheme given today per MD orders. Tolerated infusion without adverse affects. Vital signs stable. No complaints at this time. Discharged from clinic ambulatory. F/U with [facility] as scheduled.

## 2019-12-27 ENCOUNTER — Encounter (HOSPITAL_COMMUNITY): Payer: Self-pay | Admitting: General Practice

## 2019-12-27 NOTE — Progress Notes (Signed)
[place] Psychosocial Distress Screening Clinical Social Work  Clinical Social Work was referred by distress screening protocol.  The patient scored a 6 on the Psychosocial Distress Thermometer which indicates moderate distress. Clinical Social Worker contacted patient by phone to assess for distress and other psychosocial needs. Patient asleep, spoke w daughter Silva Bandy who helps care for him in the home.  She and other sister take turns caring for him at home.  Family is most concerned about needing 24 hour/day care for patient w cancer and wife w Stage 6 Alzheimers.  Patient's three children are hoping to find in home care 24/7 - they are struggling to get patient to accept in home caregiver.  Patient does not want to admit to any kind of long term care facility.  Also spoke w patient for his perspective on needs - he is aware that as his care needs increase, additional caregiving in the home may be needed.    ONCBCN DISTRESS SCREENING 12/20/2019  Screening Type Initial Screening  Distress experienced in past week (1-10) 6  Practical problem type Housing  Information Concerns Type Lack of info about diagnosis;Lack of info about treatment  Physical Problem type Pain;Sleep/insomnia;Loss of appetitie  Physician notified of physical symptoms Yes  Referral to clinical psychology No  Referral to clinical social work Yes  Referral to dietition Yes  Referral to financial advocate No  Referral to support programs No  Referral to palliative care No    Barriers to care/review of distress screen:  - Transportation:  Do you anticipate any problems getting to appointments?  Do you have someone who can help run errands for you if you need it?  Family is currently providing transportation.  Patient has also asked others to help w transport - this involves both helping someone pay for gas and paying someone to stay home w wife while patient is gone.   - Help at home:  What is your living situation (alone, family,  other)?  If you are physically unable to care for yourself, who would you call on to help you?  Lives w wife who is disabled w Stage 6 Alzheimer's disease, adult children are taking turns providing care for patient and wife.  This is becoming exhausting for family.  One sister provides care for wife for 40 hours during the week under the CAP program.  Other family members are asked to help on nights and weekends.  Family is looking into the options of paying for private caregivers to help out.   - Support system:  What does your support system look like?  Who would you call on if you needed some kind of practical help?  What if you needed someone to talk to for emotional support?  Is primary caregiver for wife who has Alzheimers, wife has Medicaid and has CAP services (40 hours/week - provider is patient's daughter). Wife gets bath aide from hospice services.   Patient does not currently have any in home care services.  "As far as my dad is concerned, he is superman."  Is often turned to by others in the community for help/resources.  Now per daughter, he cannot stand for more than 15 - 20 minutes at a time, is in pain "most of the time."  Patient used to take care of large garden, drive as needed, do yard work.  Family is aware that they will need to pay for many additional services in order to support the needs of patient and wife.   -  Finances:  Are you concerned about finances.  Considering returning to work?  If not, applying for disability?  Retired Administrator.  Wife has Medicaid, patient does not as he is over assets.    What is your understanding of where you are with your cancer? Its cause?  Your treatment plan and what happens next?  Newly diagnosed w cholangiocarcinoma, previous history of prostate cancer treated w seed implant for radiotherapy.  Aware that he will have a biopsy next week at [facility] then will have a meeting w oncologist to plan treatment.    What are your worries for the  future as you begin treatment for cancer?  No worries, " I had treatment for prostate cancer, and I didn't let it slow me down."  "Either way I win", expresses strong faith background.  What are your hopes and priorities during your treatment? What is important to you? What are your goals for your care?  Managing care for wife as well as his own cancer treatment.  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  Aware that his physical strength has significantly decreased and he is unable to continue to work in the community/church as he used to.   CSW Summary:  Patient and family psychosocial functioning including strengths, limitations, and coping skills:  84 year old married male, newly diagnosed w cholangiocarcinoma.  Past history of treatment for prostate cancer.  Lives w wife who has Alzheimer dementia.  Wife has CAP aide (daughter) for 36 hours/week - other daughter helps on nights/weekends but lives in [place].  When patient was healthy, he was quite active and able to care for wife.  Current illness has slowed him down significantly.  CAP program may be able to provide additional respite hours using outside caregiver - CAP case manager will discuss current situation w family and assess for needs/resources.  Patient has a strong faith and work ethic - optimistic and determined in his approach to his cancer diagnosis.    Identifications of barriers to care:  Patient is caregiver for wife w Alzheimer's.    Availability of community resources:  Wife has CAP Aide for 40 hours (daughter is providing care) may be able to get respite services that are built into CAP program. CAP case manager Dorma Russell [phone]).  Amanda, can also refer to [facility] for possible referral for caregiver respite resources if CAP services are inadequate.   Clinical Social Worker  follow up needed: Yes.  will schedule phone follow up after treatment is planned - will need to address multiple needs in the family including wifes caregiving needs and patient's needs related to treatment  If yes, follow up plan: See above.   Beverely Pace, [place], [place] Social Worker Phone:  [phone] Cell:  [phone]

## 2019-12-28 ENCOUNTER — Other Ambulatory Visit: Payer: Self-pay | Admitting: Radiology

## 2019-12-31 ENCOUNTER — Ambulatory Visit (HOSPITAL_COMMUNITY): Payer: Medicare Other

## 2020-01-01 ENCOUNTER — Ambulatory Visit (HOSPITAL_COMMUNITY): Payer: Medicare Other

## 2020-01-01 ENCOUNTER — Ambulatory Visit (HOSPITAL_COMMUNITY)
Admission: RE | Admit: 2020-01-01 | Discharge: 2020-01-01 | Disposition: A | Discharge status: Home / Self Care | Payer: Medicare Other | Source: Ambulatory Visit | Attending: Hematology | Admitting: Hematology

## 2020-01-01 ENCOUNTER — Telehealth (HOSPITAL_COMMUNITY): Payer: Self-pay | Admitting: General Practice

## 2020-01-01 ENCOUNTER — Encounter (HOSPITAL_COMMUNITY): Payer: Self-pay

## 2020-01-01 ENCOUNTER — Other Ambulatory Visit (HOSPITAL_COMMUNITY): Payer: Self-pay | Admitting: Hematology

## 2020-01-01 ENCOUNTER — Other Ambulatory Visit: Payer: Self-pay

## 2020-01-01 DIAGNOSIS — E119 Type 2 diabetes mellitus without complications: Secondary | ICD-10-CM | POA: Insufficient documentation

## 2020-01-01 DIAGNOSIS — Z9089 Acquired absence of other organs: Secondary | ICD-10-CM | POA: Insufficient documentation

## 2020-01-01 DIAGNOSIS — C787 Secondary malignant neoplasm of liver and intrahepatic bile duct: Secondary | ICD-10-CM | POA: Insufficient documentation

## 2020-01-01 DIAGNOSIS — C786 Secondary malignant neoplasm of retroperitoneum and peritoneum: Secondary | ICD-10-CM | POA: Diagnosis not present

## 2020-01-01 DIAGNOSIS — Z79899 Other long term (current) drug therapy: Secondary | ICD-10-CM | POA: Diagnosis not present

## 2020-01-01 DIAGNOSIS — Z87891 Personal history of nicotine dependence: Secondary | ICD-10-CM | POA: Insufficient documentation

## 2020-01-01 DIAGNOSIS — K828 Other specified diseases of gallbladder: Secondary | ICD-10-CM | POA: Diagnosis not present

## 2020-01-01 DIAGNOSIS — C221 Intrahepatic bile duct carcinoma: Secondary | ICD-10-CM

## 2020-01-01 DIAGNOSIS — Z808 Family history of malignant neoplasm of other organs or systems: Secondary | ICD-10-CM | POA: Diagnosis not present

## 2020-01-01 DIAGNOSIS — Z7982 Long term (current) use of aspirin: Secondary | ICD-10-CM | POA: Diagnosis not present

## 2020-01-01 DIAGNOSIS — I10 Essential (primary) hypertension: Secondary | ICD-10-CM | POA: Insufficient documentation

## 2020-01-01 DIAGNOSIS — K219 Gastro-esophageal reflux disease without esophagitis: Secondary | ICD-10-CM | POA: Insufficient documentation

## 2020-01-01 DIAGNOSIS — C248 Malignant neoplasm of overlapping sites of biliary tract: Secondary | ICD-10-CM | POA: Diagnosis not present

## 2020-01-01 DIAGNOSIS — C23 Malignant neoplasm of gallbladder: Secondary | ICD-10-CM | POA: Diagnosis not present

## 2020-01-01 DIAGNOSIS — R188 Other ascites: Secondary | ICD-10-CM | POA: Diagnosis not present

## 2020-01-01 DIAGNOSIS — E785 Hyperlipidemia, unspecified: Secondary | ICD-10-CM | POA: Diagnosis not present

## 2020-01-01 DIAGNOSIS — Z8546 Personal history of malignant neoplasm of prostate: Secondary | ICD-10-CM | POA: Insufficient documentation

## 2020-01-01 DIAGNOSIS — C7889 Secondary malignant neoplasm of other digestive organs: Secondary | ICD-10-CM | POA: Diagnosis present

## 2020-01-01 HISTORY — DX: Gastro-esophageal reflux disease without esophagitis: K21.9

## 2020-01-01 LAB — GLUCOSE, CAPILLARY: Glucose-Capillary: 110 mg/dL — ABNORMAL HIGH (ref 70–99)

## 2020-01-01 LAB — CBC WITH DIFFERENTIAL/PLATELET
Abs Immature Granulocytes: 0.03 10*3/uL (ref 0.00–0.07)
Basophils Absolute: 0 10*3/uL (ref 0.0–0.1)
Basophils Relative: 0 %
Eosinophils Absolute: 0.1 10*3/uL (ref 0.0–0.5)
Eosinophils Relative: 1 %
HCT: 33 % — ABNORMAL LOW (ref 39.0–52.0)
Hemoglobin: 10 g/dL — ABNORMAL LOW (ref 13.0–17.0)
Immature Granulocytes: 0 %
Lymphocytes Relative: 12 %
Lymphs Abs: 1 10*3/uL (ref 0.7–4.0)
MCH: 26 pg (ref 26.0–34.0)
MCHC: 30.3 g/dL (ref 30.0–36.0)
MCV: 85.9 fL (ref 80.0–100.0)
Monocytes Absolute: 0.5 10*3/uL (ref 0.1–1.0)
Monocytes Relative: 6 %
Neutro Abs: 6.6 10*3/uL (ref 1.7–7.7)
Neutrophils Relative %: 81 %
Platelets: 353 10*3/uL (ref 150–400)
RBC: 3.84 MIL/uL — ABNORMAL LOW (ref 4.22–5.81)
RDW: 20.6 % — ABNORMAL HIGH (ref 11.5–15.5)
WBC: 8.1 10*3/uL (ref 4.0–10.5)
nRBC: 0 % (ref 0.0–0.2)

## 2020-01-01 LAB — COMPREHENSIVE METABOLIC PANEL
ALT: 39 U/L (ref 0–44)
AST: 29 U/L (ref 15–41)
Albumin: 3.9 g/dL (ref 3.5–5.0)
Alkaline Phosphatase: 74 U/L (ref 38–126)
Anion gap: 10 (ref 5–15)
BUN: 18 mg/dL (ref 8–23)
CO2: 25 mmol/L (ref 22–32)
Calcium: 8.9 mg/dL (ref 8.9–10.3)
Chloride: 99 mmol/L (ref 98–111)
Creatinine, Ser: 1.02 mg/dL (ref 0.61–1.24)
GFR calc Af Amer: >60 mL/min (ref >60)
GFR calc non Af Amer: >60 mL/min (ref >60)
Glucose, Bld: 109 mg/dL — ABNORMAL HIGH (ref 70–99)
Potassium: 4.3 mmol/L (ref 3.5–5.1)
Sodium: 134 mmol/L — ABNORMAL LOW (ref 135–145)
Total Bilirubin: 0.4 mg/dL (ref 0.3–1.2)
Total Protein: 7 g/dL (ref 6.5–8.1)

## 2020-01-01 LAB — PROTIME-INR
INR: 1 (ref 0.8–1.2)
Prothrombin Time: 12.9 seconds (ref 11.4–15.2)

## 2020-01-01 MED ORDER — GELATIN ABSORBABLE 12-7 MM EX MISC
CUTANEOUS | Status: AC
  Filled 2020-01-01: qty 1

## 2020-01-01 MED ORDER — LIDOCAINE HCL 1 % IJ SOLN
INTRAMUSCULAR | Status: AC
  Filled 2020-01-01: qty 20

## 2020-01-01 MED ORDER — FENTANYL CITRATE (PF) 100 MCG/2ML IJ SOLN
INTRAMUSCULAR | Status: AC | PRN
  Administered 2020-01-01: 50 ug via INTRAVENOUS

## 2020-01-01 MED ORDER — FENTANYL CITRATE (PF) 100 MCG/2ML IJ SOLN
INTRAMUSCULAR | Status: AC
  Filled 2020-01-01: qty 2

## 2020-01-01 MED ORDER — SODIUM CHLORIDE 0.9 % IV SOLN: INTRAVENOUS | Status: DC

## 2020-01-01 MED ORDER — MIDAZOLAM HCL 2 MG/2ML IJ SOLN
INTRAMUSCULAR | Status: AC | PRN
  Administered 2020-01-01: 1 mg via INTRAVENOUS

## 2020-01-01 MED ORDER — MIDAZOLAM HCL 2 MG/2ML IJ SOLN
INTRAMUSCULAR | Status: AC
  Filled 2020-01-01: qty 2

## 2020-01-01 NOTE — Procedures (Signed)
Interventional Radiology Procedure Note  Procedure: US Guided Biopsy of gallbladder fossa mass  Complications: None  Estimated Blood Loss: < 10 mL  Findings: 18 G core biopsy of gallbladder mass performed under US guidance.  Three core samples obtained and sent to Pathology.  Venetia Night. Kathlene Cote, M.D Pager:  [phone]

## 2020-01-01 NOTE — Telephone Encounter (Signed)
[facility] CSW Progress Notes  Call from CAP case manager, says that family is receiving the maximum amount of CAP services available.  Daughter is primary caregiver.  Program expects that family members will fill in hours that CAP Aide is not providing care.  There are no other resources available.  Per CAP case manager, [facility] has offered a respite bed on an occasional basis for patient's wife.  Family has access to an RN from Hospice in order to facilitate these conversations and care arrangements.  Edwyna Shell, LCSW Clinical Social Worker Phone:  [phone] Cell:  [phone]

## 2020-01-01 NOTE — Discharge Instructions (Signed)
Please call Interventional Radiology clinic [phone] with any questions or concerns.  You may remove your dressing and shower tomorrow.  Liver Biopsy, Care After These instructions give you information on caring for yourself after your procedure. Your doctor may also give you more specific instructions. Call your doctor if you have any problems or questions after your procedure. What can I expect after the procedure? After the procedure, it is common to have:  Pain and soreness where the biopsy was done.  Bruising around the area where the biopsy was done.  Sleepiness and be tired for a few days. Follow these instructions at home: Medicines  Take over-the-counter and prescription medicines only as told by your doctor.  If you were prescribed an antibiotic medicine, take it as told by your doctor. Do not stop taking the antibiotic even if you start to feel better.  Do not take medicines such as aspirin and ibuprofen. These medicines can thin your blood. Do not take these medicines unless your doctor tells you to take them.  If you are taking prescription pain medicine, take actions to prevent or treat constipation. Your doctor may recommend that you: ? Drink enough fluid to keep your pee (urine) clear or pale yellow. ? Take over-the-counter or prescription medicines. ? Eat foods that are high in fiber, such as fresh fruits and vegetables, whole grains, and beans. ? Limit foods that are high in fat and processed sugars, such as fried and sweet foods. Caring for your cut  Follow instructions from your doctor about how to take care of your cuts from surgery (incisions). Make sure you: ? Wash your hands with soap and water before you change your bandage (dressing). If you cannot use soap and water, use hand sanitizer. ? Change your bandage as told by your doctor. ? Leave stitches (sutures), skin glue, or skin tape (adhesive) strips in place. They may need to stay in place for 2 weeks  or longer. If tape strips get loose and curl up, you may trim the loose edges. Do not remove tape strips completely unless your doctor says it is okay.  Check your cuts every day for signs of infection. Check for: ? Redness, swelling, or more pain. ? Fluid or blood. ? Pus or a bad smell. ? Warmth.  Do not take baths, swim, or use a hot tub until your doctor says it is okay to do so. Activity   Rest at home for 1-2 days or as told by your doctor. ? Avoid sitting for a long time without moving. Get up to take short walks every 1-2 hours.  Return to your normal activities as told by your doctor. Ask what activities are safe for you.  Do not do these things in the first 24 hours: ? Drive. ? Use machinery. ? Take a bath or shower.  Do not lift more than 10 pounds (4.5 kg) or play contact sports for the first 2 weeks. General instructions   Do not drink alcohol in the first week after the procedure.  Have someone stay with you for at least 24 hours after the procedure.  Get your test results. Ask your doctor or the department that is doing the test: ? When will my results be ready? ? How will I get my results? ? What are my treatment options? ? What other tests do I need? ? What are my next steps?  Keep all follow-up visits as told by your doctor. This is important. Contact a doctor if:    A cut bleeds and leaves more than just a small spot of blood.  A cut is red, puffs up (swells), or hurts more than before.  Fluid or something else comes from a cut.  A cut smells bad.  You have a fever or chills. Get help right away if:  You have swelling, bloating, or pain in your belly (abdomen).  You get dizzy or faint.  You have a rash.  You feel sick to your stomach (nauseous) or throw up (vomit).  You have trouble breathing, feel short of breath, or feel faint.  Your chest hurts.  You have problems talking or seeing.  You have trouble with your balance or moving your  arms or legs. Summary  After the procedure, it is common to have pain, soreness, bruising, and tiredness.  Your doctor will tell you how to take care of yourself at home. Change your bandage, take your medicines, and limit your activities as told by your doctor.  Call your doctor if you have symptoms of infection. Get help right away if your belly swells, your cut bleeds a lot, or you have trouble talking or breathing. This information is not intended to replace advice given to you by your health care provider. Make sure you discuss any questions you have with your health care provider. Document Revised: 05/27/2017 Document Reviewed: 05/27/2017 Elsevier Patient Education  2020 [employer]   Moderate Conscious Sedation, Adult, Care After These instructions provide you with information about caring for yourself after your procedure. Your health care provider may also give you more specific instructions. Your treatment has been planned according to current medical practices, but problems sometimes occur. Call your health care provider if you have any problems or questions after your procedure. What can I expect after the procedure? After your procedure, it is common:  To feel sleepy for several hours.  To feel clumsy and have poor balance for several hours.  To have poor judgment for several hours.  To vomit if you eat too soon. Follow these instructions at home: For at least 24 hours after the procedure:   Do not: ? Participate in activities where you could fall or become injured. ? Drive. ? Use heavy machinery. ? Drink alcohol. ? Take sleeping pills or medicines that cause drowsiness. ? Make important decisions or sign legal documents. ? Take care of children on your own.  Rest. Eating and drinking  Follow the diet recommended by your health care provider.  If you vomit: ? Drink water, juice, or soup when you can drink without vomiting. ? Make sure you have little or no  nausea before eating solid foods. General instructions  Have a responsible adult stay with you until you are awake and alert.  Take over-the-counter and prescription medicines only as told by your health care provider.  If you smoke, do not smoke without supervision.  Keep all follow-up visits as told by your health care provider. This is important. Contact a health care provider if:  You keep feeling nauseous or you keep vomiting.  You feel light-headed.  You develop a rash.  You have a fever. Get help right away if:  You have trouble breathing. This information is not intended to replace advice given to you by your health care provider. Make sure you discuss any questions you have with your health care provider. Document Revised: 04/29/2017 Document Reviewed: 09/06/2015 Elsevier Patient Education  2020 [employer]  

## 2020-01-01 NOTE — Consult Note (Signed)
Chief Complaint: Liver mass. Request is for liver biopsy  Referring Physician(s): Katragadda,Sreedhar  Supervising Physician: Aletta Edouard  Patient Status: [name] - Out-pt  History of Present Illness: Bruce Duarte is a 84 y.o. male History of prostate cancer. persistent and worsening abdominal pain with melena. 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 Team is requesting liver biopsy for further determination of possible cholangiocarcinoma.   Past Medical History:  Diagnosis Date  . Arthritis   . At risk for sleep apnea    STOP-BANG= 5   SENT TO PCP 01-22-2014  . Borderline type 2 diabetes mellitus   . Diabetes mellitus without complication ([name])   . GERD (gastroesophageal reflux disease)   . Hyperlipidemia   . Hypertension   . IDA (iron deficiency anemia) 07/27/2016  . Malignant neoplasm of prostate ([name]) 12/04/2013  . Prostate cancer ([name]) DX  2013   Gleason 3+3=6, vol 42.34 cc  . Wears glasses     Past Surgical History:  Procedure Laterality Date  . APPENDECTOMY  age 60  . COLONOSCOPY N/A 05/05/2016   Procedure: COLONOSCOPY;  Surgeon: Daneil Dolin, MD;  [facility];  Service: Endoscopy;  Laterality: N/A;  815  . COLONOSCOPY  01/2015   Dr. Carol Ada: hyperplastic polyps. H/O tubulovillous adenoma as well.   . COLONOSCOPY W/ BIOPSIES  2009   non-cancerous  . ESOPHAGOGASTRODUODENOSCOPY N/A 05/05/2016   Procedure: ESOPHAGOGASTRODUODENOSCOPY (EGD);  Surgeon: Daneil Dolin, MD;  Location[facility];  Service: Endoscopy;  Laterality: N/A;  . GIVENS CAPSULE STUDY N/A 09/06/2016   Procedure: GIVENS CAPSULE STUDY;  Surgeon: Daneil Dolin, MD;  Location: [facility];  Service: Endoscopy;  Laterality: N/A;  7:30am (patient will arrive at 7:00am)  . PROSTATE BIOPSY  11/05/13   adenocarcinoma, gleason 6  . RADIOACTIVE SEED IMPLANT N/A 01/29/2014   Procedure: RADIOACTIVE SEED IMPLANT;   Surgeon: Arvil Persons, MD;  Location: [facility];  Service: Urology;  Laterality: N/A;     seeds implanted no seeds found in bladder    Allergies: Patient has no known allergies.  Medications: Prior to Admission medications   Medication Sig Start Date End Date Taking? Authorizing Provider  aspirin EC 81 MG tablet Take 81 mg by mouth daily.    [provider]  docusate sodium (COLACE) 100 MG capsule Take 200 mg by mouth daily.    [provider]  dorzolamide-timolol (COSOPT) 22.3-6.8 MG/ML ophthalmic solution Place 1 drop into both eyes 2 (two) times daily.  08/21/18   [provider]  GINSENG PO Take 1 tablet by mouth daily.    [provider]  HYDROcodone-acetaminophen (NORCO/VICODIN) 5-325 MG tablet Take 1 tablet by mouth every 6 (six) hours as needed. 12/14/19   Petrucelli, Samantha R, PA-C  latanoprost (XALATAN) 0.005 % ophthalmic solution Place 1 drop into both eyes at bedtime.  08/21/18   [provider]  losartan (COZAAR) 100 MG tablet Take 100 mg by mouth daily. 11/11/17   [provider]  Magnesium Hydroxide (MILK OF MAGNESIA PO) Take by mouth. Taking for constipation.    [provider]  metFORMIN (GLUCOPHAGE) 500 MG tablet Take 2 tablets by mouth daily. 03/06/16   [provider]  Multiple Vitamins-Minerals (MULTIVITAMIN GUMMIES MENS PO) Take 1 tablet by mouth daily.    [provider]  ondansetron (ZOFRAN ODT) 4 MG disintegrating tablet Take 1 tablet (4 mg total) by mouth every 8 (  eight) hours as needed for nausea or vomiting. 12/14/19   Petrucelli, Aldona Bar R, PA-C  tamsulosin (FLOMAX) 0.4 MG CAPS capsule TAKE ONE CAPSULE BY MOUTH EVERY DAY AT BEDTIME 12/19/19   McKenzie, Candee Furbish, MD  UNABLE TO FIND Med Name: sea moss.  Patient makes into a gel and uses as a second multi vitamin source.    [provider]     Family History  Problem Relation Age of Onset  . Diabetes Mother   .  Cancer Mother        brain tumor  . Stroke Father   . Melanoma Brother   . Mental illness Sister   . Colon cancer Neg Hx     Social History   Socioeconomic History  . Marital status: Married    Spouse name: Not on file  . Number of children: Not on file  . Years of education: Not on file  . Highest education level: Not on file  Occupational History  . Not on file  Tobacco Use  . Smoking status: Former Smoker    Packs/day: 1.00    Years: 25.00    Pack years: 25.00    Types: Cigars    Quit date: 12/04/1973    Years since quitting: 46.1  . Smokeless tobacco: Never Used  Vaping Use  . Vaping Use: Never used  Substance and Sexual Activity  . Alcohol use: Yes    Comment: occasionally brandy  . Drug use: No  . Sexual activity: Not Currently  Other Topics Concern  . Not on file  Social History Narrative  . Not on file   Social Determinants of Health   Financial Resource Strain:   . Difficulty of Paying Living Expenses:   Food Insecurity: No Food Insecurity  . Worried About Charity fundraiser in the Last Year: Never true  . Ran Out of Food in the Last Year: Never true  Transportation Needs: No Transportation Needs  . Lack of Transportation (Medical): No  . Lack of Transportation (Non-Medical): No  Physical Activity:   . Days of Exercise per Week:   . Minutes of Exercise per Session:   Stress:   . Feeling of Stress :   Social Connections:   . Frequency of Communication with Friends and Family:   . Frequency of Social Gatherings with Friends and Family:   . Attends Religious Services:   . Active Member of Clubs or Organizations:   . Attends Archivist Meetings:   Marland Kitchen Marital Status:      Review of Systems: A 12 point ROS discussed and pertinent positives are indicated in the HPI above.  All other systems are negative.  Review of Systems  Constitutional: Negative for fever.  HENT: Negative for congestion.   Respiratory: Negative for cough and shortness  of breath.   Cardiovascular: Negative for chest pain.  Gastrointestinal: Negative for abdominal pain (over night. self resolved. ).  Neurological: Negative for headaches.  Psychiatric/Behavioral: Negative for behavioral problems and confusion.    Vital Signs: There were no vitals taken for this visit.  Physical Exam Vitals and nursing note reviewed.  Constitutional:      Appearance: He is well-developed.  HENT:     Head: Normocephalic.  Cardiovascular:     Rate and Rhythm: Normal rate and regular rhythm.     Heart sounds: Normal heart sounds.  Pulmonary:     Effort: Pulmonary effort is normal.  Musculoskeletal:        General: Normal  range of motion.     Cervical back: Normal range of motion.  Skin:    General: Skin is dry.  Neurological:     Mental Status: He is alert and oriented to person, place, and time.     Imaging: CT Chest W Contrast  Result Date: 12/27/2019 CLINICAL DATA:  Liver mass.  Staging. EXAM: CT CHEST WITH CONTRAST TECHNIQUE: Multidetector CT imaging of the chest was performed during intravenous contrast administration. CONTRAST:  72mL OMNIPAQUE IOHEXOL 300 MG/ML  SOLN COMPARISON:  None. FINDINGS: Cardiovascular: The heart size is normal. No substantial pericardial effusion. Coronary artery calcification is evident. Atherosclerotic calcification is noted in the wall of the thoracic aorta. Mediastinum/Nodes: 2.7 x 1.1 cm anterior mediastinal soft tissue lesion measured on 73/2. No other mediastinal lymphadenopathy There is no hilar lymphadenopathy. Calcified nodal tissue seen in the right hilum consistent with old granulomatous disease. The esophagus has normal imaging features. There is no axillary lymphadenopathy. Lungs/Pleura: 4 mm anterior right upper lobe nodule visible on 71/4. 5 mm posterior right lower lobe nodule identified on 117/4. 4 mm medial left lower lobe nodule identified on 119/4. Several additional tiny 3-4 mm subpleural pulmonary nodules are  identified bilaterally. No focal airspace consolidation. No pleural effusion. Cylindrical bronchiectasis noted in the right middle lobe, lingula, and both lower lobes. Upper Abdomen: Central lesion in the liver was better characterized on recent abdomen/pelvis CT. Small volume ascites evident and progressive since 12/14/2019 nodular soft tissue in the omentum is compatible with metastatic disease. Musculoskeletal: No worrisome lytic or sclerotic osseous abnormality. IMPRESSION: 1. 2.7 x 1.1 cm anterior mediastinal soft tissue lesion,, indeterminate but metastatic disease not excluded. PET-CT may prove helpful to further evaluate. 2. Scattered tiny bilateral pulmonary nodules measuring up to 5 mm. Close attention on follow-up recommended as metastatic disease a concern. 3. Cylindrical bronchiectasis in the right middle lobe, lingula, and both lower lobes. 4. Small volume ascites, progressive since 12/14/2019. 5. Aortic Atherosclerosis (ICD10-I70.0). Electronically Signed   By: Misty Stanley M.D.   On: 12/27/2019 10:30   CT Abdomen Pelvis W Contrast  Result Date: 12/14/2019 CLINICAL DATA:  Unspecified abdominal pain, intermittent melena EXAM: CT ABDOMEN AND PELVIS WITH CONTRAST TECHNIQUE: Multidetector CT imaging of the abdomen and pelvis was performed using the standard protocol following bolus administration of intravenous contrast. CONTRAST:  22mL OMNIPAQUE IOHEXOL 300 MG/ML  SOLN COMPARISON:  None. FINDINGS: Lower chest: The visualized lung bases are clear bilaterally. The visualized heart and pericardium are unremarkable. Hepatobiliary: The gallbladder wall is markedly thickened, lobulated, and demonstrates invasion into the adjacent hepatic parenchyma in keeping with a primary gallbladder carcinoma. This primary mass measures roughly 8.3 x 4.5 x 3.4 cm on axial image # 25/3 and coronal image # 38/7. There is a small intrahepatic metastasis within segment 5 noted posteriorly. Additionally, there is either a  serosal metastasis or direct invasion into the adjacent colonic mesentery with a a malignant mass involving the hepatic flexure of the colon, best seen on coronal image # 32/7. There is peritoneal carcinomatosis noted as well as numerous peritoneal implants noted within the pelvis likely involving the serosa of the sigmoid colon, the bladder dome, and several loops of small bowel in this region. Small volume ascites. No intra or extrahepatic biliary ductal dilation. Pancreas: Unremarkable Spleen: Unremarkable Adrenals/Urinary Tract: The adrenal glands and kidneys are unremarkable. The bladder is decompressed with several peritoneal implants involving the dome of the bladder, better seen on coronal imaging. Stomach/Bowel: Widespread peritoneal carcinomatosis is noted, as  described above, with several serosal implants involving the colon both within the hepatic flexure as well as the sigmoid colon. There is, however, no evidence of obstruction. No free intraperitoneal gas. Underlying moderate sigmoid diverticulosis noted. Appendix normal. Vascular/Lymphatic: Pathologic retroperitoneal adenopathy is seen within the left periaortic and aortocaval lymph node groups. The abdominal vasculature is age-appropriate with moderate aortoiliac atherosclerotic calcification. Reproductive: Brachytherapy seeds are seen within the prostate gland. Other: The rectum is unremarkable. Musculoskeletal: No lytic or blastic bone lesions. 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 Peritoneal implants also involve multiple loops of small bowel as well as the dome of the bladder. No evidence of bowel obstruction at this time. Electronically Signed   By: Fidela Salisbury MD   On: 12/14/2019 19:01    Labs:  CBC: Recent Labs    12/14/19 1133 12/20/19 0946  01/01/20 1127  WBC 4.4 6.4 8.1  HGB 10.1* 9.6* 10.0*  HCT 32.4* 31.4* 33.0*  PLT 319 348 353    COAGS: Recent Labs    01/01/20 1127  INR 1.0    BMP: Recent Labs    12/14/19 1133 12/20/19 0946 01/01/20 1127  NA 133* 136 134*  K 3.9 3.7 4.3  CL 93* 95* 99  CO2 28 28 25   GLUCOSE 100* 130* 109*  BUN 31* 21 18  CALCIUM 9.5 9.2 8.9  CREATININE 1.67* 1.29* 1.02  GFRNONAA 38* 52* >60  GFRAA 44* 60* >60    LIVER FUNCTION TESTS: Recent Labs    12/14/19 1133 12/20/19 0946 01/01/20 1127  BILITOT 0.6 0.4 0.4  AST 20 24 29   ALT 18 22 39  ALKPHOS 68 65 74  PROT 7.3 7.3 7.0  ALBUMIN 4.2 4.1 3.9    Assessment and Plan:  84 y.o, male outpatient. History of prostate cancer. persistent and worsening abdominal pain with melena. 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 Team is requesting liver biopsy for further determination of possible cholangiocarcinoma.  Patient has no knows allergies.  All labs and medications are within acceptable parameters.Patient is afebrile.  Risks and benefits of liver biopsy was discussed with the patient and/or patient's family including, but not limited to bleeding, infection, damage to adjacent structures or low yield requiring additional tests.  All of the questions were answered and there is agreement to proceed.  Consent signed and in chart.   Thank you for this interesting consult.  I greatly enjoyed meeting LOU LOEWE and look forward to participating in their care.  A copy of this report was sent to the requesting provider on this date.  Electronically Signed: Jacqualine Mau, NP 01/01/2020, 12:30 PM   I spent a total of  40 Minutes   in face to face in clinical consultation, greater than 50% of which was counseling/coordinating care for liver biopsy

## 2020-01-02 ENCOUNTER — Encounter (HOSPITAL_COMMUNITY): Payer: Self-pay

## 2020-01-02 ENCOUNTER — Other Ambulatory Visit (HOSPITAL_COMMUNITY): Payer: PRIVATE HEALTH INSURANCE

## 2020-01-02 ENCOUNTER — Inpatient Hospital Stay (HOSPITAL_COMMUNITY): Payer: Medicare Other | Attending: Hematology

## 2020-01-02 VITALS — BP 137/52 | HR 101 | Temp 96.9°F | Resp 18

## 2020-01-02 DIAGNOSIS — C787 Secondary malignant neoplasm of liver and intrahepatic bile duct: Secondary | ICD-10-CM | POA: Insufficient documentation

## 2020-01-02 DIAGNOSIS — C23 Malignant neoplasm of gallbladder: Secondary | ICD-10-CM | POA: Insufficient documentation

## 2020-01-02 DIAGNOSIS — D500 Iron deficiency anemia secondary to blood loss (chronic): Secondary | ICD-10-CM

## 2020-01-02 DIAGNOSIS — C221 Intrahepatic bile duct carcinoma: Secondary | ICD-10-CM | POA: Diagnosis not present

## 2020-01-02 DIAGNOSIS — Z8546 Personal history of malignant neoplasm of prostate: Secondary | ICD-10-CM | POA: Insufficient documentation

## 2020-01-02 DIAGNOSIS — C786 Secondary malignant neoplasm of retroperitoneum and peritoneum: Secondary | ICD-10-CM | POA: Diagnosis not present

## 2020-01-02 DIAGNOSIS — D509 Iron deficiency anemia, unspecified: Secondary | ICD-10-CM | POA: Diagnosis not present

## 2020-01-02 LAB — SURGICAL PATHOLOGY

## 2020-01-02 MED ORDER — SODIUM CHLORIDE 0.9 % IV SOLN: Freq: Once | INTRAVENOUS | Status: AC

## 2020-01-02 MED ORDER — SODIUM CHLORIDE 0.9 % IV SOLN
510.0000 mg | Freq: Once | INTRAVENOUS | Status: AC
  Administered 2020-01-02: 510 mg via INTRAVENOUS
  Filled 2020-01-02: qty 510

## 2020-01-02 NOTE — Patient Instructions (Signed)
[facility] at [facility] Discharge Instructions  Received Feraheme infusion today. Follow-up as scheduled   Thank you for choosing [facility] at [facility] to provide your oncology and hematology care.  To afford each patient quality time with our provider, please arrive at least 15 minutes before your scheduled appointment time.   If you have a lab appointment with the Cancer Center please come in thru the Main Entrance and check in at the main information desk.  You need to re-schedule your appointment should you arrive 10 or more minutes late.  We strive to give you quality time with our providers, and arriving late affects you and other patients whose appointments are after yours.  Also, if you no show three or more times for appointments you may be dismissed from the clinic at the providers discretion.     Again, thank you for choosing [facility].  Our hope is that these requests will decrease the amount of time that you wait before being seen by our physicians.       _____________________________________________________________  Should you have questions after your visit to [facility], please contact our office at [phone] and follow the prompts.  Our office hours are 8:00 a.m. and 4:30 p.m. Monday - Friday.  Please note that voicemails left after 4:00 p.m. may not be returned until the following business day.  We are closed weekends and major holidays.  You do have access to a nurse 24-7, just call the main number to the clinic [phone] and do not press any options, hold on the line and a nurse will answer the phone.    For prescription refill requests, have your pharmacy contact our office and allow 72 hours.    Due to Covid, you will need to wear a mask upon entering the hospital. If you do not have a mask, a mask will be given to you at the Main Entrance upon arrival. For doctor visits, patients may have  1 support person age 18 or older with them. For treatment visits, patients can not have anyone with them due to social distancing guidelines and our immunocompromised population.     

## 2020-01-02 NOTE — Progress Notes (Signed)
Bruce Duarte tolerated Feraheme infusion well without complaints or incident. Peripheral IV site checked with positive blood return noted prior to and after infusion. VSS upon discharge. Pt discharged self ambulatory in satisfactory condition

## 2020-01-03 ENCOUNTER — Other Ambulatory Visit (HOSPITAL_COMMUNITY): Payer: Self-pay

## 2020-01-03 ENCOUNTER — Encounter (HOSPITAL_COMMUNITY): Payer: Self-pay

## 2020-01-03 DIAGNOSIS — C23 Malignant neoplasm of gallbladder: Secondary | ICD-10-CM

## 2020-01-03 MED ORDER — OCTREOTIDE ACETATE 30 MG IM KIT
PACK | INTRAMUSCULAR | Status: AC
  Filled 2020-01-03: qty 1

## 2020-01-03 MED ORDER — TRAMADOL HCL 50 MG PO TABS: 50.0000 mg | ORAL_TABLET | Freq: Four times a day (QID) | ORAL | 0 refills | Status: AC | PRN

## 2020-01-03 NOTE — Progress Notes (Signed)
I spoke with the patient and his daughter, Mariann Laster today. They report that the patient is experiencing increased abdominal pain and decreased appetite. Tramadol 50mg  Q6H PRN sent to pharmacy per Dr. Delton Coombes and [facility], CMP and Bilirubin to be done on Monday. Patient's daughter aware of lab appt and visit with Dr. Delton Coombes on Monday 08/09.

## 2020-01-07 ENCOUNTER — Other Ambulatory Visit: Payer: Self-pay

## 2020-01-07 ENCOUNTER — Ambulatory Visit (HOSPITAL_COMMUNITY): Admission: RE | Admit: 2020-01-07 | Payer: Medicare Other | Source: Ambulatory Visit

## 2020-01-07 ENCOUNTER — Encounter (HOSPITAL_COMMUNITY): Payer: Self-pay

## 2020-01-07 ENCOUNTER — Emergency Department (HOSPITAL_COMMUNITY)
Admission: EM | Admit: 2020-01-07 | Discharge: 2020-01-08 | Disposition: A | Discharge status: Other Acute Inpatient Hospital | Payer: Medicare Other | Attending: Emergency Medicine | Admitting: Emergency Medicine

## 2020-01-07 ENCOUNTER — Encounter (HOSPITAL_COMMUNITY): Payer: Self-pay | Admitting: *Deleted

## 2020-01-07 ENCOUNTER — Inpatient Hospital Stay (HOSPITAL_BASED_OUTPATIENT_CLINIC_OR_DEPARTMENT_OTHER): Payer: Medicare Other | Admitting: Hematology

## 2020-01-07 ENCOUNTER — Emergency Department (HOSPITAL_COMMUNITY): Payer: Medicare Other

## 2020-01-07 ENCOUNTER — Encounter: Payer: Self-pay | Admitting: Surgery

## 2020-01-07 ENCOUNTER — Ambulatory Visit (HOSPITAL_COMMUNITY): Payer: Medicare Other | Admitting: Hematology

## 2020-01-07 ENCOUNTER — Inpatient Hospital Stay (HOSPITAL_COMMUNITY): Payer: Medicare Other

## 2020-01-07 VITALS — BP 124/71 | HR 110 | Temp 98.2°F | Resp 18 | Wt 179.4 lb

## 2020-01-07 DIAGNOSIS — C221 Intrahepatic bile duct carcinoma: Secondary | ICD-10-CM | POA: Insufficient documentation

## 2020-01-07 DIAGNOSIS — R140 Abdominal distension (gaseous): Secondary | ICD-10-CM | POA: Diagnosis not present

## 2020-01-07 DIAGNOSIS — D7389 Other diseases of spleen: Secondary | ICD-10-CM | POA: Diagnosis not present

## 2020-01-07 DIAGNOSIS — R000 Tachycardia, unspecified: Secondary | ICD-10-CM | POA: Diagnosis not present

## 2020-01-07 DIAGNOSIS — Z87891 Personal history of nicotine dependence: Secondary | ICD-10-CM | POA: Diagnosis not present

## 2020-01-07 DIAGNOSIS — R1084 Generalized abdominal pain: Secondary | ICD-10-CM | POA: Insufficient documentation

## 2020-01-07 DIAGNOSIS — K56609 Unspecified intestinal obstruction, unspecified as to partial versus complete obstruction: Secondary | ICD-10-CM | POA: Insufficient documentation

## 2020-01-07 DIAGNOSIS — K5900 Constipation, unspecified: Secondary | ICD-10-CM | POA: Diagnosis not present

## 2020-01-07 DIAGNOSIS — C61 Malignant neoplasm of prostate: Secondary | ICD-10-CM | POA: Diagnosis not present

## 2020-01-07 DIAGNOSIS — I10 Essential (primary) hypertension: Secondary | ICD-10-CM | POA: Insufficient documentation

## 2020-01-07 DIAGNOSIS — K219 Gastro-esophageal reflux disease without esophagitis: Secondary | ICD-10-CM | POA: Diagnosis not present

## 2020-01-07 DIAGNOSIS — Z7982 Long term (current) use of aspirin: Secondary | ICD-10-CM | POA: Insufficient documentation

## 2020-01-07 DIAGNOSIS — J90 Pleural effusion, not elsewhere classified: Secondary | ICD-10-CM | POA: Diagnosis not present

## 2020-01-07 DIAGNOSIS — E119 Type 2 diabetes mellitus without complications: Secondary | ICD-10-CM | POA: Diagnosis not present

## 2020-01-07 DIAGNOSIS — C784 Secondary malignant neoplasm of small intestine: Secondary | ICD-10-CM | POA: Diagnosis present

## 2020-01-07 DIAGNOSIS — I700 Atherosclerosis of aorta: Secondary | ICD-10-CM | POA: Diagnosis not present

## 2020-01-07 DIAGNOSIS — Z9189 Other specified personal risk factors, not elsewhere classified: Secondary | ICD-10-CM | POA: Insufficient documentation

## 2020-01-07 DIAGNOSIS — C786 Secondary malignant neoplasm of retroperitoneum and peritoneum: Secondary | ICD-10-CM | POA: Insufficient documentation

## 2020-01-07 DIAGNOSIS — R0602 Shortness of breath: Secondary | ICD-10-CM | POA: Diagnosis not present

## 2020-01-07 DIAGNOSIS — Z20822 Contact with and (suspected) exposure to covid-19: Secondary | ICD-10-CM | POA: Diagnosis not present

## 2020-01-07 DIAGNOSIS — Z79899 Other long term (current) drug therapy: Secondary | ICD-10-CM | POA: Diagnosis not present

## 2020-01-07 DIAGNOSIS — E785 Hyperlipidemia, unspecified: Secondary | ICD-10-CM | POA: Insufficient documentation

## 2020-01-07 DIAGNOSIS — K449 Diaphragmatic hernia without obstruction or gangrene: Secondary | ICD-10-CM | POA: Diagnosis not present

## 2020-01-07 DIAGNOSIS — R188 Other ascites: Secondary | ICD-10-CM | POA: Diagnosis present

## 2020-01-07 DIAGNOSIS — C23 Malignant neoplasm of gallbladder: Secondary | ICD-10-CM

## 2020-01-07 DIAGNOSIS — R079 Chest pain, unspecified: Secondary | ICD-10-CM | POA: Diagnosis not present

## 2020-01-07 DIAGNOSIS — K56699 Other intestinal obstruction unspecified as to partial versus complete obstruction: Secondary | ICD-10-CM | POA: Diagnosis not present

## 2020-01-07 DIAGNOSIS — R531 Weakness: Secondary | ICD-10-CM | POA: Diagnosis present

## 2020-01-07 HISTORY — DX: Intrahepatic bile duct carcinoma: C22.1

## 2020-01-07 LAB — CBC WITH DIFFERENTIAL/PLATELET
Abs Immature Granulocytes: 0.14 10*3/uL — ABNORMAL HIGH (ref 0.00–0.07)
Abs Immature Granulocytes: 0.13 10*3/uL — ABNORMAL HIGH (ref 0.00–0.07)
Basophils Absolute: 0 10*3/uL (ref 0.0–0.1)
Basophils Absolute: 0 10*3/uL (ref 0.0–0.1)
Basophils Relative: 0 %
Basophils Relative: 0 %
Eosinophils Absolute: 0.4 10*3/uL (ref 0.0–0.5)
Eosinophils Absolute: 0.3 10*3/uL (ref 0.0–0.5)
Eosinophils Relative: 3 %
Eosinophils Relative: 2 %
HCT: 36.1 % — ABNORMAL LOW (ref 39.0–52.0)
HCT: 35.5 % — ABNORMAL LOW (ref 39.0–52.0)
Hemoglobin: 10.9 g/dL — ABNORMAL LOW (ref 13.0–17.0)
Hemoglobin: 10.9 g/dL — ABNORMAL LOW (ref 13.0–17.0)
Immature Granulocytes: 1 %
Immature Granulocytes: 1 %
Lymphocytes Relative: 7 %
Lymphocytes Relative: 6 %
Lymphs Abs: 1 10*3/uL (ref 0.7–4.0)
Lymphs Abs: 0.8 10*3/uL (ref 0.7–4.0)
MCH: 25.7 pg — ABNORMAL LOW (ref 26.0–34.0)
MCH: 26 pg (ref 26.0–34.0)
MCHC: 30.2 g/dL (ref 30.0–36.0)
MCHC: 30.7 g/dL (ref 30.0–36.0)
MCV: 85.1 fL (ref 80.0–100.0)
MCV: 84.5 fL (ref 80.0–100.0)
Monocytes Absolute: 1.2 10*3/uL — ABNORMAL HIGH (ref 0.1–1.0)
Monocytes Absolute: 1.1 10*3/uL — ABNORMAL HIGH (ref 0.1–1.0)
Monocytes Relative: 8 %
Monocytes Relative: 8 %
Neutro Abs: 11.5 10*3/uL — ABNORMAL HIGH (ref 1.7–7.7)
Neutro Abs: 11.2 10*3/uL — ABNORMAL HIGH (ref 1.7–7.7)
Neutrophils Relative %: 81 %
Neutrophils Relative %: 83 %
Platelets: 464 10*3/uL — ABNORMAL HIGH (ref 150–400)
Platelets: 418 10*3/uL — ABNORMAL HIGH (ref 150–400)
RBC: 4.24 MIL/uL (ref 4.22–5.81)
RBC: 4.2 MIL/uL — ABNORMAL LOW (ref 4.22–5.81)
RDW: 21.3 % — ABNORMAL HIGH (ref 11.5–15.5)
RDW: 21.3 % — ABNORMAL HIGH (ref 11.5–15.5)
WBC: 14.2 10*3/uL — ABNORMAL HIGH (ref 4.0–10.5)
WBC: 13.5 10*3/uL — ABNORMAL HIGH (ref 4.0–10.5)
nRBC: 0 % (ref 0.0–0.2)
nRBC: 0.2 % (ref 0.0–0.2)

## 2020-01-07 LAB — COMPREHENSIVE METABOLIC PANEL
ALT: 50 U/L — ABNORMAL HIGH (ref 0–44)
ALT: 48 U/L — ABNORMAL HIGH (ref 0–44)
AST: 25 U/L (ref 15–41)
AST: 23 U/L (ref 15–41)
Albumin: 3.4 g/dL — ABNORMAL LOW (ref 3.5–5.0)
Albumin: 3.5 g/dL (ref 3.5–5.0)
Alkaline Phosphatase: 152 U/L — ABNORMAL HIGH (ref 38–126)
Alkaline Phosphatase: 160 U/L — ABNORMAL HIGH (ref 38–126)
Anion gap: 15 (ref 5–15)
Anion gap: 14 (ref 5–15)
BUN: 31 mg/dL — ABNORMAL HIGH (ref 8–23)
BUN: 34 mg/dL — ABNORMAL HIGH (ref 8–23)
CO2: 22 mmol/L (ref 22–32)
CO2: 22 mmol/L (ref 22–32)
Calcium: 8.7 mg/dL — ABNORMAL LOW (ref 8.9–10.3)
Calcium: 8.7 mg/dL — ABNORMAL LOW (ref 8.9–10.3)
Chloride: 99 mmol/L (ref 98–111)
Chloride: 99 mmol/L (ref 98–111)
Creatinine, Ser: 1.28 mg/dL — ABNORMAL HIGH (ref 0.61–1.24)
Creatinine, Ser: 1.32 mg/dL — ABNORMAL HIGH (ref 0.61–1.24)
GFR calc Af Amer: >60 mL/min (ref >60)
GFR calc Af Amer: 58 mL/min — ABNORMAL LOW (ref >60)
GFR calc non Af Amer: 52 mL/min — ABNORMAL LOW (ref >60)
GFR calc non Af Amer: 50 mL/min — ABNORMAL LOW (ref >60)
Glucose, Bld: 145 mg/dL — ABNORMAL HIGH (ref 70–99)
Glucose, Bld: 124 mg/dL — ABNORMAL HIGH (ref 70–99)
Potassium: 4.2 mmol/L (ref 3.5–5.1)
Potassium: 4.2 mmol/L (ref 3.5–5.1)
Sodium: 136 mmol/L (ref 135–145)
Sodium: 135 mmol/L (ref 135–145)
Total Bilirubin: 0.9 mg/dL (ref 0.3–1.2)
Total Bilirubin: 0.7 mg/dL (ref 0.3–1.2)
Total Protein: 7.2 g/dL (ref 6.5–8.1)
Total Protein: 7.2 g/dL (ref 6.5–8.1)

## 2020-01-07 LAB — URINALYSIS, ROUTINE W REFLEX MICROSCOPIC
Bilirubin Urine: NEGATIVE
Glucose, UA: NEGATIVE mg/dL
Hgb urine dipstick: NEGATIVE
Ketones, ur: 5 mg/dL — AB
Leukocytes,Ua: NEGATIVE
Nitrite: NEGATIVE
Protein, ur: NEGATIVE mg/dL
Specific Gravity, Urine: >1.046 — ABNORMAL HIGH (ref 1.005–1.030)
pH: 5 (ref 5.0–8.0)

## 2020-01-07 LAB — BRAIN NATRIURETIC PEPTIDE: B Natriuretic Peptide: 596 pg/mL — ABNORMAL HIGH (ref 0.0–100.0)

## 2020-01-07 LAB — BILIRUBIN, DIRECT: Bilirubin, Direct: 0.3 mg/dL — ABNORMAL HIGH (ref 0.0–0.2)

## 2020-01-07 LAB — LIPASE, BLOOD: Lipase: 48 U/L (ref 11–51)

## 2020-01-07 LAB — TROPONIN I (HIGH SENSITIVITY): Troponin I (High Sensitivity): 12 ng/L (ref <18)

## 2020-01-07 LAB — LACTIC ACID, PLASMA: Lactic Acid, Venous: 1.9 mmol/L (ref 0.5–1.9)

## 2020-01-07 LAB — SARS CORONAVIRUS 2 BY RT PCR (HOSPITAL ORDER, PERFORMED IN ~~LOC~~ HOSPITAL LAB): SARS Coronavirus 2: NEGATIVE

## 2020-01-07 MED ORDER — SODIUM CHLORIDE 0.9 % IV BOLUS: 1000.0000 mL | Freq: Once | INTRAVENOUS | Status: DC

## 2020-01-07 MED ORDER — MORPHINE SULFATE (PF) 2 MG/ML IV SOLN
4.0000 mg | INTRAVENOUS | Status: DC | PRN
  Administered 2020-01-07 – 2020-01-08 (×3): 4 mg via INTRAVENOUS
  Filled 2020-01-07 (×3): qty 2

## 2020-01-07 MED ORDER — IOHEXOL 300 MG/ML  SOLN
100.0000 mL | Freq: Once | INTRAMUSCULAR | Status: AC | PRN
  Administered 2020-01-07: 100 mL via INTRAVENOUS

## 2020-01-07 MED ORDER — SODIUM CHLORIDE 0.9 % IV SOLN: Freq: Once | INTRAVENOUS | Status: AC

## 2020-01-07 MED ORDER — ENSURE ENLIVE PO LIQD
237.0000 mL | Freq: Two times a day (BID) | ORAL | Status: DC
  Filled 2020-01-07 (×4): qty 237

## 2020-01-07 MED ORDER — LACTULOSE 20 GM/30ML PO SOLN: ORAL | 2 refills | Status: AC

## 2020-01-07 NOTE — Progress Notes (Signed)
Referral for Home Health PT, OT, RN, home health aide and social work sent to [place]. Romualdo Bolk, RN with [facility] aware. Awaiting response.

## 2020-01-07 NOTE — Progress Notes (Signed)
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    CANCER STAGING: Cancer Staging No matching staging information was found for the patient.  INTERVAL HISTORY:  [name], a 84 y.o. 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 Bruce Duarte was last seen on 12/20/2019.  Today he is accompanied by his daughter. He reports that his abdomen is swollen with gas and painful particularly in the RLQ, constipation and obstipation for the past 2 weeks, overall weakness and unable to move around in bed. He is taking Colace and milk of magnesia for his constipation, but it has not helped. He complains of pain over the biopsy site. His appetite and energy levels are depleted; he denies N/V. Yesterday he took 1 tablespoon of chicken noodle soup and 1 can of Glucerna.  He is not able to ambulate at all due to weakness. There is always one child present in the house to take care of him and his wife. He needs help with his ADL's especially since experiencing worsening weakness.    REVIEW OF SYSTEMS:  Review of Systems  Constitutional: Positive for appetite change (depleted), fatigue (depleted) and unexpected weight change.  Respiratory: Positive for shortness of breath.   Gastrointestinal: Positive for abdominal distention (gas and bloating), abdominal pain and constipation (x 2 weeks). Negative for nausea and vomiting.  Musculoskeletal: Positive for arthralgias (10/10 overall body pain).  Neurological: Positive for dizziness.  All other systems reviewed and are negative.   PAST MEDICAL/SURGICAL  HISTORY:  Past Medical History:  Diagnosis Date  . Arthritis   . At risk for sleep apnea    STOP-BANG= 5   SENT TO PCP 01-22-2014  . Borderline type 2 diabetes mellitus   . Diabetes mellitus without complication ([facility])   . GERD (gastroesophageal reflux disease)   . Hyperlipidemia   . Hypertension   . IDA (iron deficiency anemia) 07/27/2016  . Malignant neoplasm of prostate ([facility]) 12/04/2013  . Prostate cancer ([facility]) DX  2013   Gleason 3+3=6, vol 42.34 cc  . Wears glasses    Past Surgical History:  Procedure Laterality Date  . APPENDECTOMY  age 71  . COLONOSCOPY N/A 05/05/2016   Procedure: COLONOSCOPY;  Surgeon: Daneil Dolin, MD;  [facility];  Service: Endoscopy;  Laterality: N/A;  815  . COLONOSCOPY  01/2015   Dr. Carol Ada: hyperplastic polyps. H/O tubulovillous adenoma as well.   . COLONOSCOPY W/ BIOPSIES  2009   non-cancerous  . ESOPHAGOGASTRODUODENOSCOPY N/A 05/05/2016   Procedure: ESOPHAGOGASTRODUODENOSCOPY (EGD);  Surgeon: Daneil Dolin, MD;  [facility];  Service: Endoscopy;  Laterality: N/A;  . GIVENS CAPSULE STUDY N/A 09/06/2016   Procedure: GIVENS CAPSULE STUDY;  Surgeon: Daneil Dolin, MD;  [facility];  Service: Endoscopy;  Laterality: N/A;  7:30am (patient will arrive at 7:00am)  . PROSTATE BIOPSY  11/05/13   adenocarcinoma, gleason 6  . RADIOACTIVE SEED IMPLANT N/A 01/29/2014   Procedure: RADIOACTIVE SEED IMPLANT;  Surgeon: Arvil Persons, MD;  Location: [facility];  Service: Urology;  Laterality: N/A;  seeds implanted no seeds found in bladder    SOCIAL HISTORY:  Social History   Socioeconomic History  . Marital status: Married    Spouse name: Not on file  . Number of children: Not on file  . Years of education: Not on file  . Highest education level: Not on file  Occupational History  . Not on file  Tobacco Use  . Smoking status: Former Smoker    Packs/day: 1.00    Years: 25.00    Pack years: 25.00     Types: Cigars    Quit date: 12/04/1973    Years since quitting: 46.1  . Smokeless tobacco: Never Used  Vaping Use  . Vaping Use: Never used  Substance and Sexual Activity  . Alcohol use: Yes    Comment: occasionally brandy  . Drug use: No  . Sexual activity: Not Currently  Other Topics Concern  . Not on file  Social History Narrative  . Not on file   Social Determinants of Health   Financial Resource Strain:   . Difficulty of Paying Living Expenses:   Food Insecurity: No Food Insecurity  . Worried About Charity fundraiser in the Last Year: Never true  . Ran Out of Food in the Last Year: Never true  Transportation Needs: No Transportation Needs  . Lack of Transportation (Medical): No  . Lack of Transportation (Non-Medical): No  Physical Activity:   . Days of Exercise per Week:   . Minutes of Exercise per Session:   Stress:   . Feeling of Stress :   Social Connections:   . Frequency of Communication with Friends and Family:   . Frequency of Social Gatherings with Friends and Family:   . Attends Religious Services:   . Active Member of Clubs or Organizations:   . Attends Archivist Meetings:   Marland Kitchen Marital Status:   Intimate Partner Violence: Not At Risk  . Fear of Current or Ex-Partner: No  . Emotionally Abused: No  . Physically Abused: No  . Sexually Abused: No    FAMILY HISTORY:  Family History  Problem Relation Age of Onset  . Diabetes Mother   . Cancer Mother        brain tumor  . Stroke Father   . Melanoma Brother   . Mental illness Sister   . Colon cancer Neg Hx     CURRENT MEDICATIONS:  Current Outpatient Medications  Medication Sig Dispense Refill  . aspirin EC 81 MG tablet Take 81 mg by mouth daily.    Marland Kitchen docusate sodium (COLACE) 100 MG capsule Take 200 mg by mouth daily.    . dorzolamide-timolol (COSOPT) 22.3-6.8 MG/ML ophthalmic solution Place 1 drop into both eyes 2 (two) times daily.     Marland Kitchen GINSENG PO Take 1 tablet by mouth daily.    Marland Kitchen  latanoprost (XALATAN) 0.005 % ophthalmic solution Place 1 drop into both eyes at bedtime.     Marland Kitchen losartan (COZAAR) 100 MG tablet Take 100 mg by mouth daily.  1  . losartan-hydrochlorothiazide (HYZAAR) 100-25 MG tablet Take 1 tablet by mouth daily.    . Magnesium Hydroxide (MILK OF MAGNESIA PO) Take by mouth. Taking for constipation.    . metFORMIN (GLUCOPHAGE) 500 MG tablet Take 2 tablets by mouth daily.    . Multiple Vitamins-Minerals (MULTIVITAMIN GUMMIES MENS PO) Take 1 tablet by mouth daily.    . tamsulosin (FLOMAX) 0.4 MG CAPS capsule TAKE ONE CAPSULE BY MOUTH EVERY DAY AT BEDTIME  90 capsule 1  . UNABLE TO FIND Med Name: sea moss.  Patient makes into a gel and uses as a second multi vitamin source.    Marland Kitchen HYDROcodone-acetaminophen (NORCO/VICODIN) 5-325 MG tablet Take 1 tablet by mouth every 6 (six) hours as needed. (Patient not taking: Reported on 01/07/2020) 12 tablet 0  . ondansetron (ZOFRAN ODT) 4 MG disintegrating tablet Take 1 tablet (4 mg total) by mouth every 8 (eight) hours as needed for nausea or vomiting. (Patient not taking: Reported on 01/07/2020) 5 tablet 0  . traMADol (ULTRAM) 50 MG tablet Take 1 tablet (50 mg total) by mouth every 6 (six) hours as needed. (Patient not taking: Reported on 01/07/2020) 56 tablet 0   No current facility-administered medications for this visit.    ALLERGIES:  No Known Allergies  PHYSICAL EXAM:  Performance status (ECOG): 1 - Symptomatic but completely ambulatory  Vitals:   01/07/20 1131  BP: 124/71  Pulse: (!) 110  Resp: 18  Temp: 98.2 F (36.8 C)  SpO2: 93%   Wt Readings from Last 3 Encounters:  01/07/20 179 lb 6.4 oz (81.4 kg)  12/20/19 182 lb 11.2 oz (82.9 kg)  12/14/19 220 lb (99.8 kg)   Physical Exam Vitals reviewed.  Constitutional:      Appearance: Normal appearance.  Abdominal:     General: There is distension.     Tenderness: There is abdominal tenderness in the right lower quadrant and left lower quadrant.     Hernia: No  hernia is present.  Neurological:     General: No focal deficit present.     Mental Status: He is alert and oriented to person, place, and time.  Psychiatric:        Mood and Affect: Mood normal.        Behavior: Behavior normal.      LABORATORY DATA:  I have reviewed the labs as listed.  CBC Latest Ref Rng & Units 01/07/2020 01/01/2020 12/20/2019  WBC 4.0 - 10.5 K/uL 14.2(H) 8.1 6.4  Hemoglobin 13.0 - 17.0 g/dL 10.9(L) 10.0(L) 9.6(L)  Hematocrit 39 - 52 % 36.1(L) 33.0(L) 31.4(L)  Platelets 150 - 400 K/uL 464(H) 353 348   CMP Latest Ref Rng & Units 01/07/2020 01/01/2020 12/20/2019  Glucose 70 - 99 mg/dL 145(H) 109(H) 130(H)  BUN 8 - 23 mg/dL 31(H) 18 21  Creatinine 0.61 - 1.24 mg/dL 1.28(H) 1.02 1.29(H)  Sodium 135 - 145 mmol/L 136 134(L) 136  Potassium 3.5 - 5.1 mmol/L 4.2 4.3 3.7  Chloride 98 - 111 mmol/L 99 99 95(L)  CO2 22 - 32 mmol/L 22 25 28   Calcium 8.9 - 10.3 mg/dL 8.7(L) 8.9 9.2  Total Protein 6.5 - 8.1 g/dL 7.2 7.0 7.3  Total Bilirubin 0.3 - 1.2 mg/dL 0.9 0.4 0.4  Alkaline Phos 38 - 126 U/L 152(H) 74 65  AST 15 - 41 U/L 25 29 24   ALT 0 - 44 U/L 50(H) 39 22   Lab Results  Component Value Date   CEA1 432.0 (H) 12/20/2019   Lab Results  Component Value Date   TIBC 327 12/20/2019   TIBC 263 12/25/2018   TIBC 348 08/22/2018   FERRITIN 66 12/20/2019   FERRITIN 194 12/25/2018   FERRITIN 35 08/22/2018   IRONPCTSAT 6 (L) 12/20/2019   IRONPCTSAT 8 (L) 12/25/2018   IRONPCTSAT 18 08/22/2018   Surgical pathology [id number]) from 01/01/2020: Biopsy of gallbladder fossa: adenocarcinoma.  DIAGNOSTIC IMAGING:  I have independently reviewed the scans and discussed with the patient. CT  Chest W Contrast  Result Date: 12/27/2019 CLINICAL DATA:  Liver mass.  Staging. EXAM: CT CHEST WITH CONTRAST TECHNIQUE: Multidetector CT imaging of the chest was performed during intravenous contrast administration. CONTRAST:  30m OMNIPAQUE IOHEXOL 300 MG/ML  SOLN COMPARISON:  None. FINDINGS:  Cardiovascular: The heart size is normal. No substantial pericardial effusion. Coronary artery calcification is evident. Atherosclerotic calcification is noted in the wall of the thoracic aorta. Mediastinum/Nodes: 2.7 x 1.1 cm anterior mediastinal soft tissue lesion measured on 73/2. No other mediastinal lymphadenopathy There is no hilar lymphadenopathy. Calcified nodal tissue seen in the right hilum consistent with old granulomatous disease. The esophagus has normal imaging features. There is no axillary lymphadenopathy. Lungs/Pleura: 4 mm anterior right upper lobe nodule visible on 71/4. 5 mm posterior right lower lobe nodule identified on 117/4. 4 mm medial left lower lobe nodule identified on 119/4. Several additional tiny 3-4 mm subpleural pulmonary nodules are identified bilaterally. No focal airspace consolidation. No pleural effusion. Cylindrical bronchiectasis noted in the right middle lobe, lingula, and both lower lobes. Upper Abdomen: Central lesion in the liver was better characterized on recent abdomen/pelvis CT. Small volume ascites evident and progressive since 12/14/2019 nodular soft tissue in the omentum is compatible with metastatic disease. Musculoskeletal: No worrisome lytic or sclerotic osseous abnormality. IMPRESSION: 1. 2.7 x 1.1 cm anterior mediastinal soft tissue lesion,, indeterminate but metastatic disease not excluded. PET-CT may prove helpful to further evaluate. 2. Scattered tiny bilateral pulmonary nodules measuring up to 5 mm. Close attention on follow-up recommended as metastatic disease a concern. 3. Cylindrical bronchiectasis in the right middle lobe, lingula, and both lower lobes. 4. Small volume ascites, progressive since 12/14/2019. 5. Aortic Atherosclerosis (ICD10-I70.0). Electronically Signed   By: EMisty StanleyM.D.   On: 12/27/2019 10:30   CT Abdomen Pelvis W Contrast  Result Date: 12/14/2019 CLINICAL DATA:  Unspecified abdominal pain, intermittent melena EXAM: CT  ABDOMEN AND PELVIS WITH CONTRAST TECHNIQUE: Multidetector CT imaging of the abdomen and pelvis was performed using the standard protocol following bolus administration of intravenous contrast. CONTRAST:  774mOMNIPAQUE IOHEXOL 300 MG/ML  SOLN COMPARISON:  None. FINDINGS: Lower chest: The visualized lung bases are clear bilaterally. The visualized heart and pericardium are unremarkable. Hepatobiliary: The gallbladder wall is markedly thickened, lobulated, and demonstrates invasion into the adjacent hepatic parenchyma in keeping with a primary gallbladder carcinoma. This primary mass measures roughly 8.3 x 4.5 x 3.4 cm on axial image # 25/3 and coronal image # 38/7. There is a small intrahepatic metastasis within segment 5 noted posteriorly. Additionally, there is either a serosal metastasis or direct invasion into the adjacent colonic mesentery with a a malignant mass involving the hepatic flexure of the colon, best seen on coronal image # 32/7. There is peritoneal carcinomatosis noted as well as numerous peritoneal implants noted within the pelvis likely involving the serosa of the sigmoid colon, the bladder dome, and several loops of small bowel in this region. Small volume ascites. No intra or extrahepatic biliary ductal dilation. Pancreas: Unremarkable Spleen: Unremarkable Adrenals/Urinary Tract: The adrenal glands and kidneys are unremarkable. The bladder is decompressed with several peritoneal implants involving the dome of the bladder, better seen on coronal imaging. Stomach/Bowel: Widespread peritoneal carcinomatosis is noted, as described above, with several serosal implants involving the colon both within the hepatic flexure as well as the sigmoid colon. There is, however, no evidence of obstruction. No free intraperitoneal gas. Underlying moderate sigmoid diverticulosis noted. Appendix normal. Vascular/Lymphatic: Pathologic retroperitoneal adenopathy is seen within the  left periaortic and aortocaval lymph  node groups. The abdominal vasculature is age-appropriate with moderate aortoiliac atherosclerotic calcification. Reproductive: Brachytherapy seeds are seen within the prostate gland. Other: The rectum is unremarkable. Musculoskeletal: No lytic or blastic bone lesions. 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 Peritoneal implants also involve multiple loops of small bowel as well as the dome of the bladder. No evidence of bowel obstruction at this time. Electronically Signed   By: Fidela Salisbury MD   On: 12/14/2019 19:01   [place] CORE BIOPSY (LIVER)  Result Date: 01/01/2020 INDICATION: Mass of gallbladder fossa invading the liver with additional evidence of carcinomatosis by CT. EXAM: 1. ULTRASOUND-GUIDED ASPIRATION PERIHEPATIC ASCITES 2. ULTRASOUND GUIDED CORE BIOPSY OF GALLBLADDER FOSSA MASS MEDICATIONS: None. ANESTHESIA/SEDATION: Fentanyl 50 mcg IV; Versed 1.0 mg IV Moderate Sedation Time:  20 minutes. The patient was continuously monitored during the procedure by the interventional radiology nurse under my direct supervision. PROCEDURE: The procedure, risks, benefits, and alternatives were explained to the patient. Questions regarding the procedure were encouraged and answered. The patient understands and consents to the procedure. A time-out was performed prior to initiating the procedure. Ultrasound was performed of the liver and gallbladder fossa. The right abdominal wall was prepped with chlorhexidine in a sterile fashion, and a sterile drape was applied covering the operative field. A sterile gown and sterile gloves were used for the procedure. Local anesthesia was provided with 1% Lidocaine. A 17 gauge trocar needle was advanced into the perihepatic space just anterior to the right lobe of the liver. Perihepatic ascites was  aspirated through the needle. The needle was then advanced through the liver parenchyma and to the level of a gallbladder fossa mass invading the liver. Three separate coaxial 18 gauge core biopsy samples were obtained and submitted in formalin. A slurry of Gel-Foam pledgets was injected through the outer needle as the needle was retracted and removed. COMPLICATIONS: None immediate. FINDINGS: Large ill-defined gallbladder fossa mass is present engulfing the gallbladder and likely directly invading the liver parenchyma. Margins of the mass are ill-defined and normal gallbladder architecture is loss. The mass measures roughly 4.5 cm in width by ultrasound. There was a small amount of adjacent perihepatic ascites present prior to biopsy. In order to reduce the risk bleeding after percutaneous biopsy, some of the fluid adjacent to the liver was removed prior to tissue biopsy. Roughly 100 mL of clear, yellow fluid was removed. Solid tissue was obtained with core biopsy. IMPRESSION: Ultrasound-guided core biopsy performed of an ill-defined soft tissue mass centered in the region of the gallbladder fossa and likely invading the adjacent liver. Prior to tissue biopsy, approximately 100 mL of ascites adjacent to the liver was removed. Electronically Signed   By: Aletta Edouard M.D.   On: 01/01/2020 15:25   [place] IMAGE GUIDED FLUID DRAIN BY CATHETER  Result Date: 01/01/2020 INDICATION: Mass of gallbladder fossa invading the liver with additional evidence of carcinomatosis by CT. EXAM: 1. ULTRASOUND-GUIDED ASPIRATION PERIHEPATIC ASCITES 2. ULTRASOUND GUIDED CORE BIOPSY OF GALLBLADDER FOSSA MASS MEDICATIONS: None. ANESTHESIA/SEDATION: Fentanyl 50 mcg IV; Versed 1.0 mg IV Moderate Sedation Time:  20 minutes. The patient was continuously monitored during the procedure by the interventional radiology nurse under my direct supervision. PROCEDURE: The procedure, risks, benefits, and alternatives were explained to the patient.  Questions regarding the procedure were encouraged and answered. The patient understands and consents  to the procedure. A time-out was performed prior to initiating the procedure. Ultrasound was performed of the liver and gallbladder fossa. The right abdominal wall was prepped with chlorhexidine in a sterile fashion, and a sterile drape was applied covering the operative field. A sterile gown and sterile gloves were used for the procedure. Local anesthesia was provided with 1% Lidocaine. A 17 gauge trocar needle was advanced into the perihepatic space just anterior to the right lobe of the liver. Perihepatic ascites was aspirated through the needle. The needle was then advanced through the liver parenchyma and to the level of a gallbladder fossa mass invading the liver. Three separate coaxial 18 gauge core biopsy samples were obtained and submitted in formalin. A slurry of Gel-Foam pledgets was injected through the outer needle as the needle was retracted and removed. COMPLICATIONS: None immediate. FINDINGS: Large ill-defined gallbladder fossa mass is present engulfing the gallbladder and likely directly invading the liver parenchyma. Margins of the mass are ill-defined and normal gallbladder architecture is loss. The mass measures roughly 4.5 cm in width by ultrasound. There was a small amount of adjacent perihepatic ascites present prior to biopsy. In order to reduce the risk bleeding after percutaneous biopsy, some of the fluid adjacent to the liver was removed prior to tissue biopsy. Roughly 100 mL of clear, yellow fluid was removed. Solid tissue was obtained with core biopsy. IMPRESSION: Ultrasound-guided core biopsy performed of an ill-defined soft tissue mass centered in the region of the gallbladder fossa and likely invading the adjacent liver. Prior to tissue biopsy, approximately 100 mL of ascites adjacent to the liver was removed. Electronically Signed   By: Aletta Edouard M.D.   On: 01/01/2020 15:25       ASSESSMENT:  1.  Metastatic gallbladder cancer with peritoneal carcinomatosis: -On and off lower abdominal pain since March 2021 -CT [facility] on 12/14/2019 shows mass in the gallbladder fossa 8.3 x 4.5 x 3.4 cm with 1 small intrahepatic meta stasis.  There is peritoneal carcinomatosis with numerous peritoneal implants within the pelvis involving the serosa of sigmoid colon, bladder dome and several loops of small bowel.  Serosal metastasis or direct invasion into adjacent colonic mesentery with a malignant mass involving hepatic flexure of the colon. -Colonoscopy on 05/05/2016 shows polyp at the splenic flexure, diverticulosis in the sigmoid and in the descending colon. -EGD on 05/05/2016 shows normal esophagus, there is a gastropathy and normal duodenal bulb. -40 pound weight loss in the last 4 months, decreased eating secondary to bloating and gas and loss of taste. -CT chest on 12/18/2019 shows 2.7 x 1.1 cm anterior mediastinal soft tissue lesion.  Several subcentimeter scattered bilateral lung nodules. -Biopsy of the gallbladder fossa mass on 01/01/2020 shows adenocarcinoma, compatible with gallbladder/biliary primary.  2.  Prostate cancer: -He underwent seed implants 3 to 4 years ago.  PSA on 10/01/2019 was <0.1.  3.  Iron deficiency anemia: -Previous history of gastric erosions, diverticulosis and polyps.  He was receiving intermittent Feraheme infusions. -Last Feraheme on 12/26/2019 and 01/02/2020.  4.  Family history: -Mother had brain cancer and brother had melanoma.   PLAN:  1.  Gallbladder carcinoma with peritoneal carcinomatosis: -We reviewed results of the CT of the chest and biopsy results. -Patient reported that his pain has worsened in the last 3 weeks.  He also reported not having a bowel movement in the last 2 weeks with progressive abdominal distention.  Physical exam shows distended abdomen concerning for either ascites or distention. -I have reviewed his  labs from today which  showed mildly elevated ALT with normal bilirubin.  Alk phos was elevated at 160. -I have recommended evaluation in the emergency room with imaging and possible paracentesis. -I have called ER physician and discussed the case with him.  2.  Iron deficiency anemia: -Received Feraheme on 01/02/2020. -Hemoglobin today is 10.9 with MCV of 84.5.   Orders placed this encounter:  No orders of the defined types were placed in this encounter.    Derek Jack, MD [place] [phone]   I, Milinda Antis, am acting as a scribe for Dr. Sanda Linger.  I, Derek Jack MD, have reviewed the above documentation for accuracy and completeness, and I agree with the above.

## 2020-01-07 NOTE — ED Provider Notes (Signed)
[facility] EMERGENCY DEPARTMENT Provider Note   CSN: [id number] Arrival date & time: 01/07/20  1237     History Chief Complaint  Patient presents with  . Weakness    Bruce Duarte is a 84 y.o. male.  HPI      84 year old male, with a PMH of gallbladder cancer, diabetes, HLD, HTN, iron deficiency anemia, presents with constipation, abdominal pain and weakness.  He has not had a bowel movement in 3 weeks.  He has noted some abdominal distention over the last several days with associated pain.  He has felt more weak over the last 3 to 4 days.  Per daughter at bedside he is usually able to get himself up and perform his ADLs.  However over the last 3 to 4 days he has not been able to get up due to weakness.  She notes that he wet the bed last night because he was too weak to stand.  Patient denies any focal weakness, numbness, tingling.  He does note some shortness of breath which she has felt over the last 3 to 4 days with the abdominal distention.  He denies any known fevers, chills, nausea, vomiting.   Past Medical History:  Diagnosis Date  . Arthritis   . At risk for sleep apnea    STOP-BANG= 5   SENT TO PCP 01-22-2014  . Borderline type 2 diabetes mellitus   . Cholangiocarcinoma ([place])   . Diabetes mellitus without complication ([place])   . GERD (gastroesophageal reflux disease)   . Hyperlipidemia   . Hypertension   . IDA (iron deficiency anemia) 07/27/2016  . Malignant neoplasm of prostate ([place]) 12/04/2013  . Prostate cancer ([place]) DX  2013   Gleason 3+3=6, vol 42.34 cc  . Wears glasses     Patient Active Problem List   Diagnosis Date Noted  . Benign prostatic hyperplasia with urinary obstruction 10/08/2019  . Weak urinary stream 10/08/2019  . IDA (iron deficiency anemia) 07/27/2016  . History of colonic polyps 04/07/2016  . Prostate cancer ([place]) 12/04/2013    Past Surgical History:  Procedure Laterality Date  . APPENDECTOMY  age 43  . COLONOSCOPY N/A 05/05/2016    Procedure: COLONOSCOPY;  Surgeon: Daneil Dolin, MD;  Location: [facility] ENDO SUITE;  Service: Endoscopy;  Laterality: N/A;  815  . COLONOSCOPY  01/2015   Dr. Carol Ada: hyperplastic polyps. H/O tubulovillous adenoma as well.   . COLONOSCOPY W/ BIOPSIES  2009   non-cancerous  . ESOPHAGOGASTRODUODENOSCOPY N/A 05/05/2016   Procedure: ESOPHAGOGASTRODUODENOSCOPY (EGD);  Surgeon: Daneil Dolin, MD;  Location: [facility];  Service: Endoscopy;  Laterality: N/A;  . GIVENS CAPSULE STUDY N/A 09/06/2016   Procedure: GIVENS CAPSULE STUDY;  Surgeon: Daneil Dolin, MD;  Location: [facility];  Service: Endoscopy;  Laterality: N/A;  7:30am (patient will arrive at 7:00am)  . PROSTATE BIOPSY  11/05/13   adenocarcinoma, gleason 6  . RADIOACTIVE SEED IMPLANT N/A 01/29/2014   Procedure: RADIOACTIVE SEED IMPLANT;  Surgeon: Arvil Persons, MD;  Location: [facility];  Service: Urology;  Laterality: N/A;     seeds implanted no seeds found in bladder       Family History  Problem Relation Age of Onset  . Diabetes Mother   . Cancer Mother        brain tumor  . Stroke Father   . Melanoma Brother   . Mental illness Sister   . Colon cancer Neg Hx     Social  History   Tobacco Use  . Smoking status: Former Smoker    Packs/day: 1.00    Years: 25.00    Pack years: 25.00    Types: Cigars    Quit date: 12/04/1973    Years since quitting: 46.1  . Smokeless tobacco: Never Used  Vaping Use  . Vaping Use: Never used  Substance Use Topics  . Alcohol use: Yes    Comment: occasionally brandy  . Drug use: No    Home Medications Prior to Admission medications   Medication Sig Start Date End Date Taking? Authorizing Provider  aspirin EC 81 MG tablet Take 81 mg by mouth daily.    [provider]  docusate sodium (COLACE) 100 MG capsule Take 200 mg by mouth daily.    [provider]  dorzolamide-timolol (COSOPT) 22.3-6.8 MG/ML ophthalmic solution Place 1 drop into both eyes 2 (two)  times daily.  08/21/18   [provider]  GINSENG PO Take 1 tablet by mouth daily.    [provider]  HYDROcodone-acetaminophen (NORCO/VICODIN) 5-325 MG tablet Take 1 tablet by mouth every 6 (six) hours as needed. Patient not taking: Reported on 01/07/2020 12/14/19   Petrucelli, Glynda Jaeger, PA-C  Lactulose 20 GM/30ML SOLN Take every 3 hours until you produce a bowel movement, then switch to once at night. 01/07/20   Derek Jack, MD  latanoprost (XALATAN) 0.005 % ophthalmic solution Place 1 drop into both eyes at bedtime.  08/21/18   [provider]  losartan (COZAAR) 100 MG tablet Take 100 mg by mouth daily. 11/11/17   [provider]  losartan-hydrochlorothiazide (HYZAAR) 100-25 MG tablet Take 1 tablet by mouth daily. 01/01/20   [provider]  Magnesium Hydroxide (MILK OF MAGNESIA PO) Take by mouth. Taking for constipation.    [provider]  metFORMIN (GLUCOPHAGE) 500 MG tablet Take 2 tablets by mouth daily. 03/06/16   [provider]  Multiple Vitamins-Minerals (MULTIVITAMIN GUMMIES MENS PO) Take 1 tablet by mouth daily.    [provider]  ondansetron (ZOFRAN ODT) 4 MG disintegrating tablet Take 1 tablet (4 mg total) by mouth every 8 (eight) hours as needed for nausea or vomiting. Patient not taking: Reported on 01/07/2020 12/14/19   Petrucelli, Aldona Bar R, PA-C  tamsulosin (FLOMAX) 0.4 MG CAPS capsule TAKE ONE CAPSULE BY MOUTH EVERY DAY AT BEDTIME 12/19/19   [place], Candee Furbish, MD  traMADol (ULTRAM) 50 MG tablet Take 1 tablet (50 mg total) by mouth every 6 (six) hours as needed. Patient not taking: Reported on 01/07/2020 01/03/20   Glennie Isle, NP-C  UNABLE TO FIND Med Name: sea moss.  Patient makes into a gel and uses as a second multi vitamin source.    [provider]    Allergies    Patient has no known allergies.  Review of Systems   Review of Systems  Constitutional: Negative for chills and fever.    HENT: Negative for rhinorrhea and sore throat.   Eyes: Negative for visual disturbance.  Respiratory: Positive for shortness of breath. Negative for cough.   Cardiovascular: Negative for chest pain and leg swelling.  Gastrointestinal: Positive for abdominal pain and constipation. Negative for diarrhea, nausea and vomiting.  Genitourinary: Negative for dysuria, frequency and urgency.  Skin: Negative for rash and wound.  Neurological: Positive for weakness (generalized). Negative for syncope and numbness.  All other systems reviewed and are negative.   Physical Exam Updated Vital Signs BP 129/87 (BP Location: Right Arm)   Pulse Marland Kitchen)  126   Temp 98.5 F (36.9 C) (Oral)   Resp 18   Ht 5\' 9"  (1.753 m)   Wt 26.5 kg   SpO2 95%   BMI 8.62 kg/m   Physical Exam Vitals and nursing note reviewed.  Constitutional:      Appearance: He is well-developed.  HENT:     Head: Normocephalic and atraumatic.  Eyes:     Conjunctiva/sclera: Conjunctivae normal.  Cardiovascular:     Rate and Rhythm: Regular rhythm. Tachycardia present.     Heart sounds: Normal heart sounds. No murmur heard.   Pulmonary:     Effort: Pulmonary effort is normal. No respiratory distress.     Breath sounds: Normal breath sounds. No wheezing or rales.  Abdominal:     General: Bowel sounds are decreased. There is distension.     Palpations: Abdomen is rigid.     Tenderness: There is generalized abdominal tenderness.  Musculoskeletal:        General: No tenderness or deformity. Normal range of motion.     Cervical back: Neck supple.  Skin:    General: Skin is warm and dry.     Findings: No erythema or rash.  Neurological:     Mental Status: He is alert and oriented to person, place, and time.  Psychiatric:        Behavior: Behavior normal.     ED Results / Procedures / Treatments   Labs (all labs ordered are listed, but only abnormal results are displayed) Labs Reviewed  CBC WITH DIFFERENTIAL/PLATELET   COMPREHENSIVE METABOLIC PANEL  LIPASE, BLOOD  URINALYSIS, ROUTINE W REFLEX MICROSCOPIC  BRAIN NATRIURETIC PEPTIDE  TROPONIN I (HIGH SENSITIVITY)    EKG None  Radiology No results found.  Procedures .Critical Care Performed by: Etter Sjogren, PA-C Authorized by: Etter Sjogren, PA-C   Critical care provider statement:    Critical care time (minutes):  45   Critical care time was exclusive of:  Separately billable procedures and treating other patients   Critical care was necessary to treat or prevent imminent or life-threatening deterioration of the following conditions:  Dehydration, endocrine crisis and metabolic crisis   Critical care was time spent personally by me on the following activities:  Discussions with consultants, evaluation of patient's response to treatment, examination of patient, ordering and performing treatments and interventions, ordering and review of laboratory studies, ordering and review of radiographic studies, pulse oximetry, re-evaluation of patient's condition, obtaining history from patient or surrogate and review of old charts   (including critical care time)  Medications Ordered in ED Medications - No data to display  ED Course  I have reviewed the triage vital signs and the nursing notes.  Pertinent labs & imaging results that were available during my care of the patient were reviewed by me and considered in my medical decision making (see chart for details).    MDM Rules/Calculators/A&P                           1626: Spoke with Dr. Arnoldo Morale with gen surg. He recommends transfer to [facility] for surgical intervention.   1650: Courtesy call to Oncology,Dr. Delton Coombes, with an update on CT results and plan for transfer.   1657: [facility] returned call.  Both with hematology oncology, Dr. Fatima Sanger.  He recommends discussion directly with surgery.  If surgery will not admit the patient, he will.  Awaiting call back from  surgery.   1730: Woke with  Dr. London Pepper at [facility] with surgery.  He accepts the patient to a waitlist.  However no beds available in the recommend trying other facilities for faster bed placement.   1830: Spoke with Dr. Johney Maine, general surgery at [facility].  He follows that the patient is not a surgical candidate at this time.  He states that the patient is admitted to hematology/oncology or medicine, he can consult on the patient.  1845: Discussed case again with Dr. Delton Coombes.  He did not feel that the patient was a good candidate for radiation oncology at this time.  He felt the patient would be best served for transfer to [facility].   We will continue to await a bed at [facility].  Informed patient and family of likely long wait time.  He is agreeable with me in the ED.  He is requesting Ensure and pain medicine.  Orders have been placed for as needed pain medicine and Ensure drinks.  Additional maintenance fluids ordered.    Final Clinical Impression(s) / ED Diagnoses Final diagnoses:  None    Rx / DC Orders ED Discharge Orders    None       Rachel Moulds 01/07/20 2025    Fredia Sorrow, MD 01/07/20 2052

## 2020-01-07 NOTE — Patient Instructions (Signed)
[place] at [facility] Discharge Instructions  You were seen today by Dr. Delton Coombes. He went over your recent results and scans. The pathology reports was positive for metastatic gallbladder cancer. Treatment options (gemcitabine and cisplatin) and hospice care was discussed today. You will be prescribed lactulose for your constipation; take every 3 hours as needed to get a bowel movement, then take only once daily after you had a bowel movement. Increase your caloric intake to improve your weight. Stay physically active as much as tolerable.    Thank you for choosing [address] at [facility] to provide your oncology and hematology care.  To afford each patient quality time with our provider, please arrive at least 15 minutes before your scheduled appointment time.   If you have a lab appointment with the [facility] please come in thru the Main Entrance and check in at the main information desk  You need to re-schedule your appointment should you arrive 10 or more minutes late.  We strive to give you quality time with our providers, and arriving late affects you and other patients whose appointments are after yours.  Also, if you no show three or more times for appointments you may be dismissed from the clinic at the providers discretion.     Again, thank you for choosing [facility].  Our hope is that these requests will decrease the amount of time that you wait before being seen by our physicians.       _____________________________________________________________  Should you have questions after your visit to [facility], please contact our office at [phone] between the hours of 8:00 a.m. and 4:30 p.m.  Voicemails left after 4:00 p.m. will not be returned until the following business day.  For prescription refill requests, have your pharmacy contact our office and allow 72 hours.    Cancer Center Support  Programs:   > Cancer Support Group  2nd Tuesday of the month 1pm-2pm, Journey Room

## 2020-01-07 NOTE — ED Triage Notes (Signed)
Pt came from cancer center, recently diagnosed with cholangiocarcinoma and has a decline in ADL's in the last 3 weeks; pt has abdominal distention and increased pain; no BM x 3 weeks

## 2020-01-08 ENCOUNTER — Other Ambulatory Visit: Payer: Self-pay

## 2020-01-08 ENCOUNTER — Encounter (HOSPITAL_COMMUNITY): Payer: Self-pay

## 2020-01-08 DIAGNOSIS — C787 Secondary malignant neoplasm of liver and intrahepatic bile duct: Secondary | ICD-10-CM | POA: Diagnosis not present

## 2020-01-08 DIAGNOSIS — K668 Other specified disorders of peritoneum: Secondary | ICD-10-CM | POA: Diagnosis not present

## 2020-01-08 DIAGNOSIS — K631 Perforation of intestine (nontraumatic): Secondary | ICD-10-CM | POA: Diagnosis not present

## 2020-01-08 DIAGNOSIS — C221 Intrahepatic bile duct carcinoma: Secondary | ICD-10-CM | POA: Diagnosis not present

## 2020-01-08 DIAGNOSIS — R451 Restlessness and agitation: Secondary | ICD-10-CM | POA: Diagnosis not present

## 2020-01-08 DIAGNOSIS — R748 Abnormal levels of other serum enzymes: Secondary | ICD-10-CM | POA: Diagnosis not present

## 2020-01-08 DIAGNOSIS — Z8546 Personal history of malignant neoplasm of prostate: Secondary | ICD-10-CM | POA: Diagnosis not present

## 2020-01-08 DIAGNOSIS — R509 Fever, unspecified: Secondary | ICD-10-CM | POA: Diagnosis not present

## 2020-01-08 DIAGNOSIS — C7911 Secondary malignant neoplasm of bladder: Secondary | ICD-10-CM | POA: Diagnosis not present

## 2020-01-08 DIAGNOSIS — R1084 Generalized abdominal pain: Secondary | ICD-10-CM | POA: Diagnosis not present

## 2020-01-08 DIAGNOSIS — D72829 Elevated white blood cell count, unspecified: Secondary | ICD-10-CM | POA: Diagnosis not present

## 2020-01-08 DIAGNOSIS — E785 Hyperlipidemia, unspecified: Secondary | ICD-10-CM | POA: Diagnosis not present

## 2020-01-08 DIAGNOSIS — Z20822 Contact with and (suspected) exposure to covid-19: Secondary | ICD-10-CM | POA: Diagnosis not present

## 2020-01-08 DIAGNOSIS — G4733 Obstructive sleep apnea (adult) (pediatric): Secondary | ICD-10-CM | POA: Diagnosis not present

## 2020-01-08 DIAGNOSIS — C784 Secondary malignant neoplasm of small intestine: Secondary | ICD-10-CM | POA: Diagnosis not present

## 2020-01-08 DIAGNOSIS — E875 Hyperkalemia: Secondary | ICD-10-CM | POA: Diagnosis not present

## 2020-01-08 DIAGNOSIS — C61 Malignant neoplasm of prostate: Secondary | ICD-10-CM | POA: Diagnosis not present

## 2020-01-08 DIAGNOSIS — K5900 Constipation, unspecified: Secondary | ICD-10-CM | POA: Diagnosis not present

## 2020-01-08 DIAGNOSIS — R000 Tachycardia, unspecified: Secondary | ICD-10-CM | POA: Diagnosis not present

## 2020-01-08 DIAGNOSIS — D509 Iron deficiency anemia, unspecified: Secondary | ICD-10-CM | POA: Diagnosis not present

## 2020-01-08 DIAGNOSIS — I10 Essential (primary) hypertension: Secondary | ICD-10-CM | POA: Diagnosis not present

## 2020-01-08 DIAGNOSIS — K56609 Unspecified intestinal obstruction, unspecified as to partial versus complete obstruction: Secondary | ICD-10-CM | POA: Diagnosis not present

## 2020-01-08 DIAGNOSIS — F419 Anxiety disorder, unspecified: Secondary | ICD-10-CM | POA: Diagnosis not present

## 2020-01-08 DIAGNOSIS — K219 Gastro-esophageal reflux disease without esophagitis: Secondary | ICD-10-CM | POA: Diagnosis not present

## 2020-01-08 DIAGNOSIS — D4989 Neoplasm of unspecified behavior of other specified sites: Secondary | ICD-10-CM | POA: Diagnosis not present

## 2020-01-08 DIAGNOSIS — E872 Acidosis: Secondary | ICD-10-CM | POA: Diagnosis not present

## 2020-01-08 DIAGNOSIS — E119 Type 2 diabetes mellitus without complications: Secondary | ICD-10-CM | POA: Diagnosis not present

## 2020-01-08 DIAGNOSIS — R5383 Other fatigue: Secondary | ICD-10-CM | POA: Diagnosis not present

## 2020-01-08 DIAGNOSIS — C786 Secondary malignant neoplasm of retroperitoneum and peritoneum: Secondary | ICD-10-CM | POA: Diagnosis not present

## 2020-01-08 LAB — CBC WITH DIFFERENTIAL/PLATELET
Abs Immature Granulocytes: 0.09 K/uL — ABNORMAL HIGH (ref 0.00–0.07)
Basophils Absolute: 0 K/uL (ref 0.0–0.1)
Basophils Relative: 0 %
Eosinophils Absolute: 0 K/uL (ref 0.0–0.5)
Eosinophils Relative: 0 %
HCT: 32.1 % — ABNORMAL LOW (ref 39.0–52.0)
Hemoglobin: 9.9 g/dL — ABNORMAL LOW (ref 13.0–17.0)
Immature Granulocytes: 1 %
Lymphocytes Relative: 7 %
Lymphs Abs: 0.7 K/uL (ref 0.7–4.0)
MCH: 26.3 pg (ref 26.0–34.0)
MCHC: 30.8 g/dL (ref 30.0–36.0)
MCV: 85.1 fL (ref 80.0–100.0)
Monocytes Absolute: 0.8 K/uL (ref 0.1–1.0)
Monocytes Relative: 8 %
Neutro Abs: 9 K/uL — ABNORMAL HIGH (ref 1.7–7.7)
Neutrophils Relative %: 84 %
Platelets: 407 K/uL — ABNORMAL HIGH (ref 150–400)
RBC: 3.77 MIL/uL — ABNORMAL LOW (ref 4.22–5.81)
RDW: 21.2 % — ABNORMAL HIGH (ref 11.5–15.5)
WBC: 10.7 K/uL — ABNORMAL HIGH (ref 4.0–10.5)
nRBC: 0 % (ref 0.0–0.2)

## 2020-01-08 LAB — COMPREHENSIVE METABOLIC PANEL
ALT: 37 U/L (ref 0–44)
AST: 19 U/L (ref 15–41)
Albumin: 2.8 g/dL — ABNORMAL LOW (ref 3.5–5.0)
Alkaline Phosphatase: 133 U/L — ABNORMAL HIGH (ref 38–126)
Anion gap: 13 (ref 5–15)
BUN: 34 mg/dL — ABNORMAL HIGH (ref 8–23)
CO2: 23 mmol/L (ref 22–32)
Calcium: 8.1 mg/dL — ABNORMAL LOW (ref 8.9–10.3)
Chloride: 102 mmol/L (ref 98–111)
Creatinine, Ser: 1.14 mg/dL (ref 0.61–1.24)
GFR calc Af Amer: >60 mL/min (ref >60)
GFR calc non Af Amer: 60 mL/min — ABNORMAL LOW (ref >60)
Glucose, Bld: 110 mg/dL — ABNORMAL HIGH (ref 70–99)
Potassium: 4.2 mmol/L (ref 3.5–5.1)
Sodium: 138 mmol/L (ref 135–145)
Total Bilirubin: 0.8 mg/dL (ref 0.3–1.2)
Total Protein: 6.2 g/dL — ABNORMAL LOW (ref 6.5–8.1)

## 2020-01-08 LAB — LACTIC ACID, PLASMA: Lactic Acid, Venous: 1.3 mmol/L (ref 0.5–1.9)

## 2020-01-08 MED ORDER — LACTATED RINGERS IV BOLUS
1000.0000 mL | Freq: Once | INTRAVENOUS | Status: AC
  Administered 2020-01-08: 1000 mL via INTRAVENOUS

## 2020-01-08 NOTE — ED Provider Notes (Signed)
EKG Interpretation  Date/Time:  Tuesday January 08 2020 05:40:21 EDT Ventricular Rate:  125 PR Interval:    QRS Duration: 81 QT Interval:  303 QTC Calculation: 437 R Axis:   69 Text Interpretation: Sinus tachycardia Borderline repolarization abnormality No significant change since last tracing Confirmed by Ripley Fraise [phone]) on 01/08/2020 6:59:41 AM      I was informed by nursing the patient's heart rate is increased.  EKG reveals sinus tachycardia Patient denies any increased pain.  Repeat lactate negative.  Patient remains afebrile.  Patient given IV fluids and pain medications as needed.  He is awaiting transfer to [place], MD 01/08/20 0700

## 2020-01-08 NOTE — ED Notes (Signed)
Report given to [facility] ground transport at this time. ETA 35 min for transport to [place].

## 2020-01-08 NOTE — Progress Notes (Signed)
Patient has been accepted by [facility] per Romualdo Bolk, [facility] RN.

## 2020-01-08 NOTE — ED Provider Notes (Signed)
Signout from Dr. Christy Gentles.  84 year old male here with bowel obstruction.  Has been increasingly tachycardic.  IV fluids and pain medicine ordered.  Awaiting transfer to [facility]. Physical Exam  BP (!) 144/82   Pulse (!) 121   Temp 99.3 F (37.4 C) (Oral)   Resp 15   Ht 5\' 9"  (1.753 m)   Wt 81.4 kg   SpO2 99%   BMI 26.49 kg/m   Physical Exam  ED Course/Procedures     Procedures  MDM  Patient is awake and alert.  Denying any significant pain.  Transport team is here to transfer to [facility].       Hayden Rasmussen, MD 01/08/20 1718

## 2020-01-09 ENCOUNTER — Encounter (HOSPITAL_COMMUNITY): Payer: Self-pay | Admitting: Hematology

## 2020-01-09 DIAGNOSIS — D4989 Neoplasm of unspecified behavior of other specified sites: Secondary | ICD-10-CM | POA: Diagnosis not present

## 2020-01-09 DIAGNOSIS — C221 Intrahepatic bile duct carcinoma: Secondary | ICD-10-CM | POA: Diagnosis not present

## 2020-01-09 DIAGNOSIS — K668 Other specified disorders of peritoneum: Secondary | ICD-10-CM | POA: Diagnosis not present

## 2020-01-10 ENCOUNTER — Encounter (HOSPITAL_COMMUNITY): Payer: Self-pay

## 2020-01-10 NOTE — Progress Notes (Signed)
Foundation One and PDL1 results placed in the chart. 

## 2020-01-12 LAB — CULTURE, BLOOD (ROUTINE X 2)
Culture: NO GROWTH
Culture: NO GROWTH
Special Requests: ADEQUATE

## 2020-01-15 DIAGNOSIS — C23 Malignant neoplasm of gallbladder: Secondary | ICD-10-CM | POA: Diagnosis not present

## 2020-01-17 ENCOUNTER — Encounter (HOSPITAL_COMMUNITY): Payer: Self-pay

## 2020-01-17 ENCOUNTER — Encounter (HOSPITAL_COMMUNITY): Payer: Self-pay | Admitting: Hematology

## 2020-01-17 NOTE — Progress Notes (Signed)
Foundation One results present in Oncology History

## 2020-01-21 ENCOUNTER — Ambulatory Visit (HOSPITAL_COMMUNITY): Payer: Medicare Other | Admitting: Hematology

## 2020-01-30 DEATH — deceased

## 2020-10-08 ENCOUNTER — Ambulatory Visit: Payer: Medicare Other | Admitting: Urology

## 2021-10-11 IMAGING — RF DG ESOPHAGUS
10 of 11 series · 15 of 24 positions shown · non-contrast
Comparison: None

CLINICAL DATA: Dysphagia, reflux

EXAM:
ESOPHOGRAM / BARIUM SWALLOW / BARIUM TABLET STUDY
TECHNIQUE: Combined double contrast and single contrast examination performed
using effervescent crystals, thick barium liquid, and thin barium
liquid. The patient was observed with fluoroscopy swallowing a 13 mm
barium sulphate tablet.
FLUOROSCOPY TIME:  Fluoroscopy Time:  1 minutes 12 seconds
Radiation Exposure Index (if provided by the fluoroscopic device):
30.9 mGy
Number of Acquired Spot Images: multiple fluoroscopic screen
captures

[Series 1: cp_standard · 0.17mm/px · 2 of 19 frames shown (1 of 10)]
[frame 3/19]
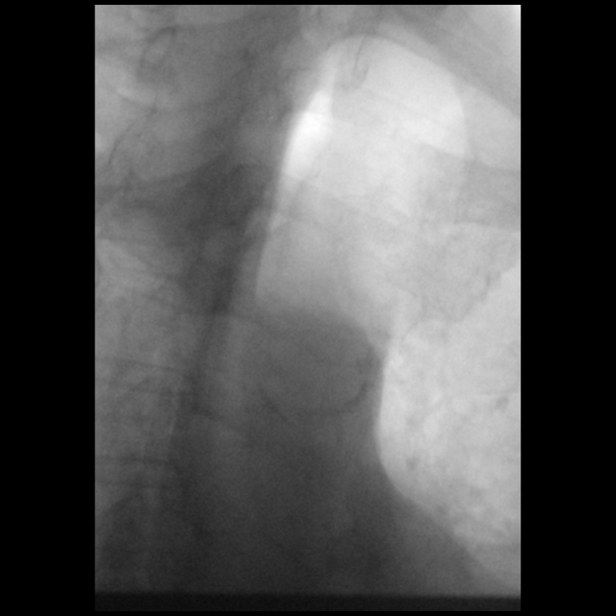
[frame 17/19]
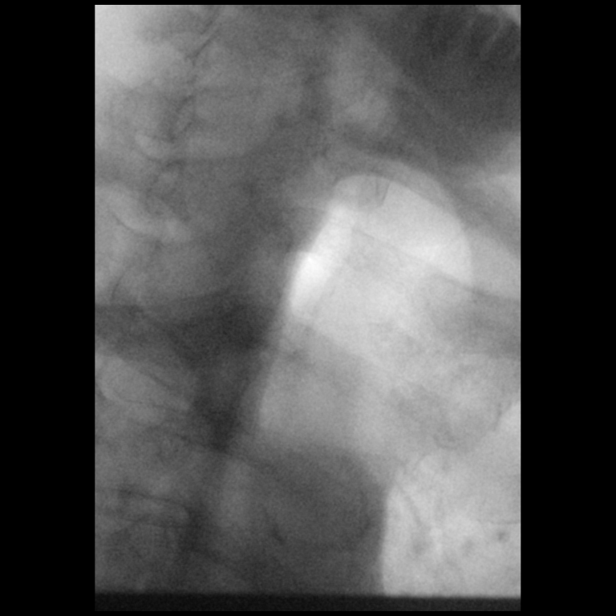

[Series 2: cp_standard · 0.17mm/px · 1 of 87 frames shown (2 of 10)]
[frame 74/87]
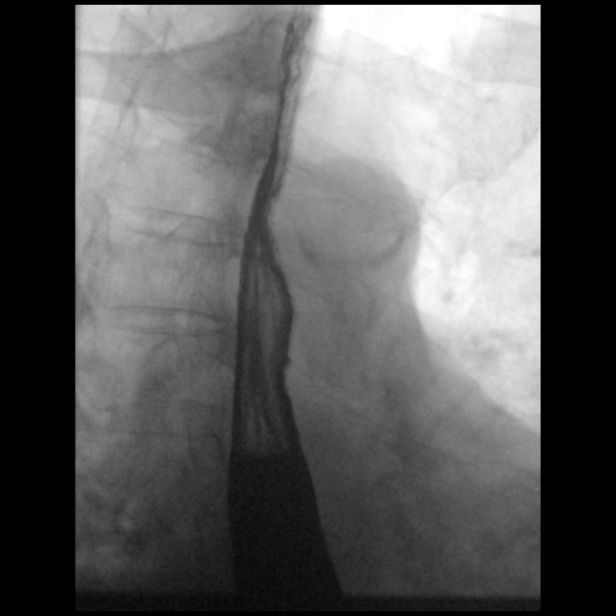

[Series 3: cp_standard · 0.17mm/px · 1 of 100 frames shown (3 of 10)]
[frame 16/100]
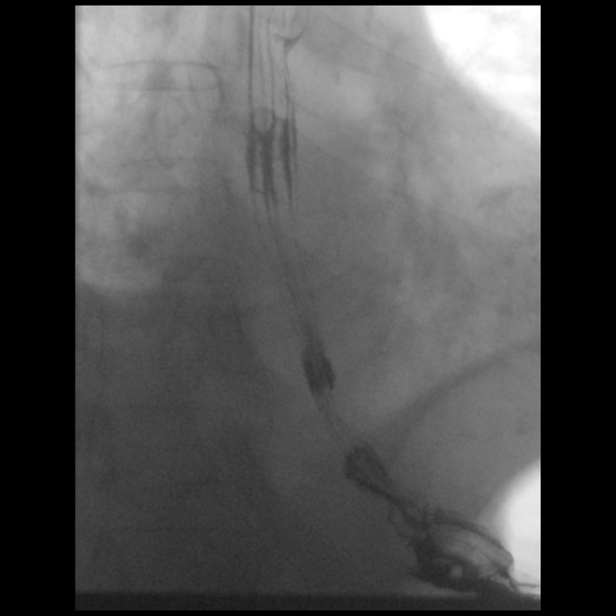

[Series 4: cp_standard · 0.17mm/px · 2 of 98 frames shown (4 of 10)]
[frame 15/98]
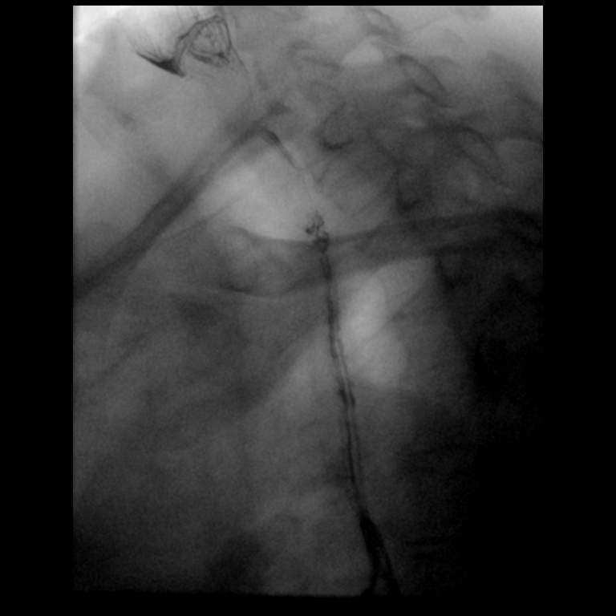
[frame 50/98]
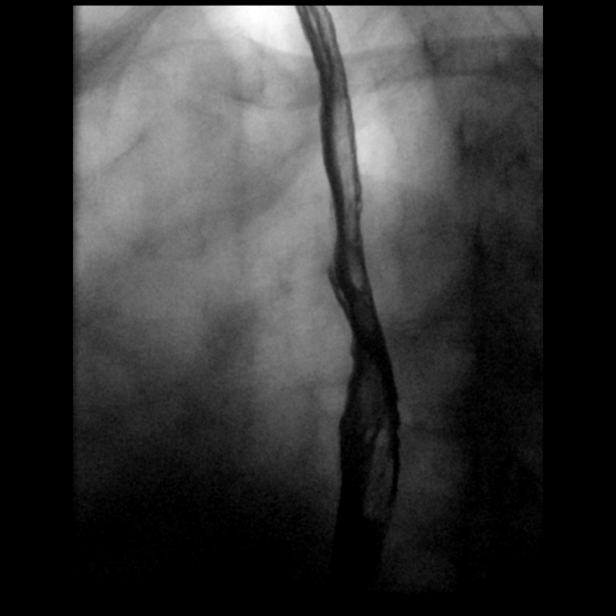

[Series 5: cp_standard · 0.17mm/px · 1 of 88 frames shown (5 of 10)]
[frame 14/88]
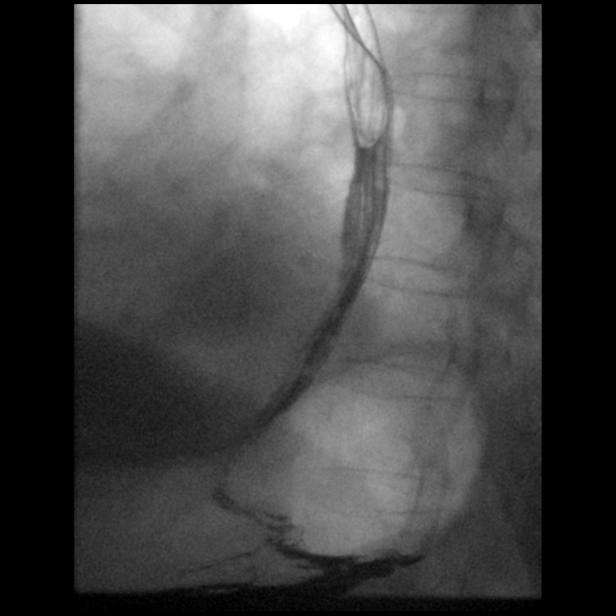

[Series 6: cp_standard · 0.27mm/px · 2 of 102 frames shown (6 of 10)]
[frame 30/102]
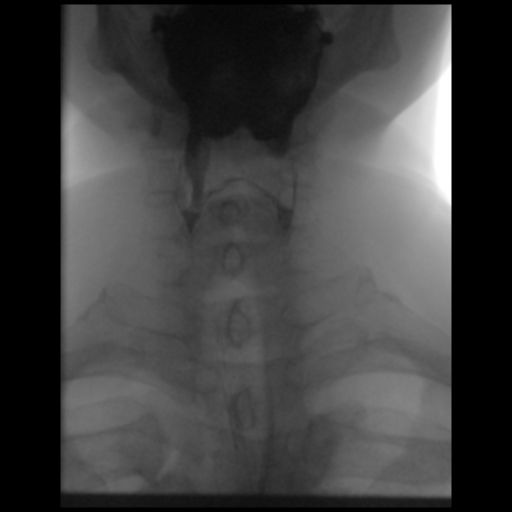
[frame 87/102]
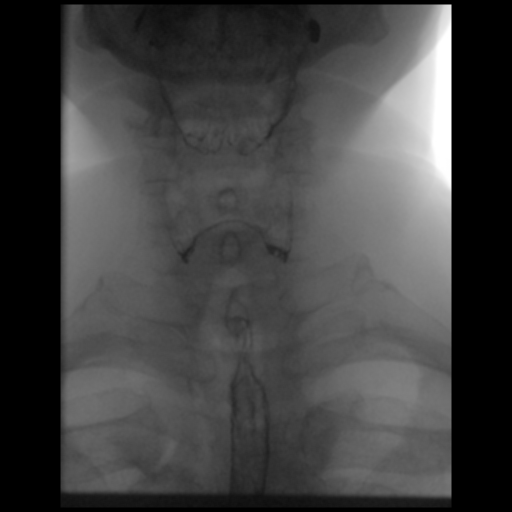

[Series 8: cp_standard · 0.26mm/px · 2 of 98 frames shown (7 of 10)]
[frame 50/98]
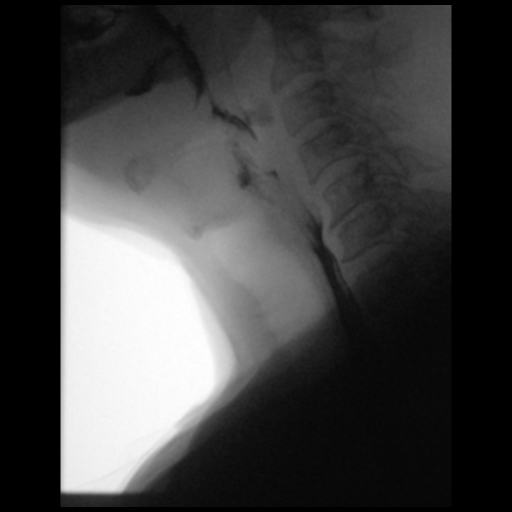
[frame 84/98]
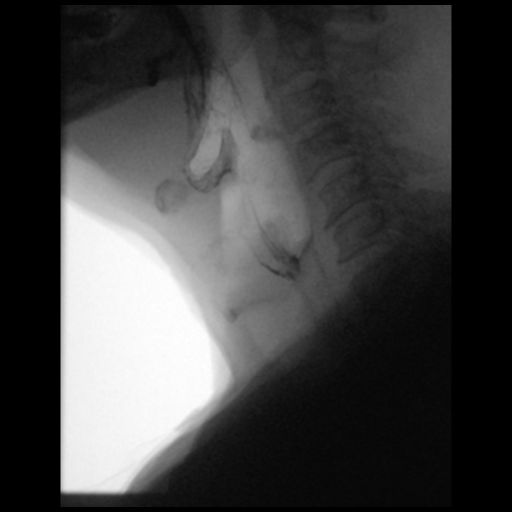

[Series 9: cp_standard · 0.18mm/px · 1 of 101 frames shown (8 of 10)]
[frame 51/101]
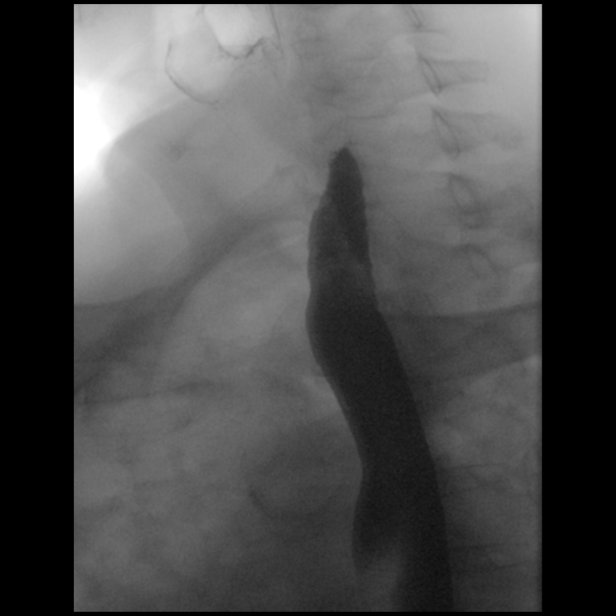

[Series 10: cp_standard · 0.18mm/px · 1 of 20 frames shown (9 of 10)]
[frame 15/20]
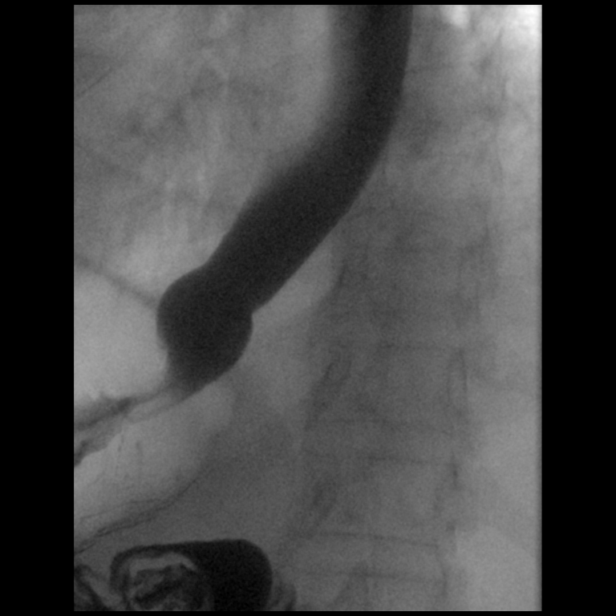

[Series 11: cp_standard · 0.18mm/px · 2 of 103 frames shown (10 of 10)]
[frame 5/103]
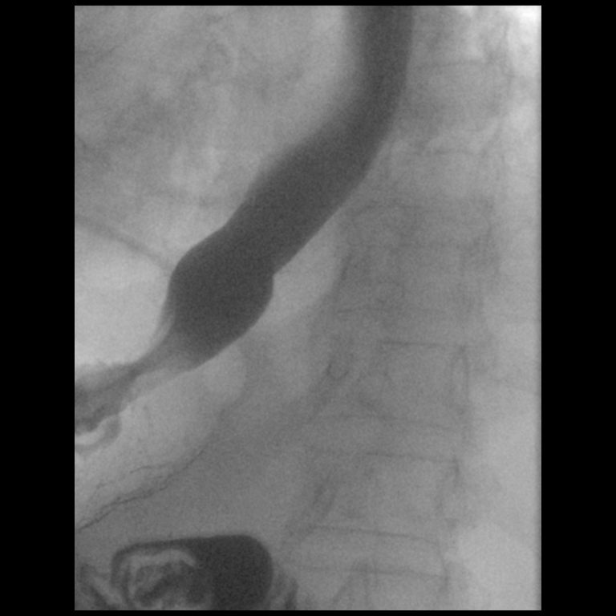
[frame 88/103]
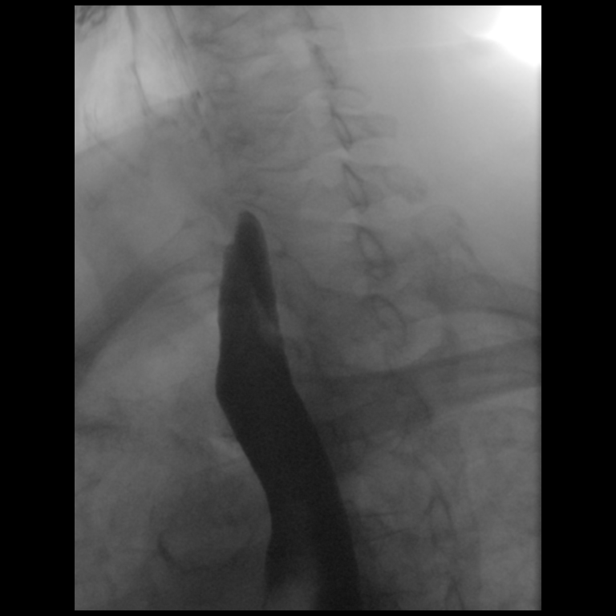

[15 of 24 positions shown; findings below may reference images not displayed]

FINDINGS: Esophageal distention: Normal without mass or stricture

Filling defects:  None

12.5 mm barium tablet: Easily passed from oral cavity to stomach
without obstruction

Motility: Diffuse age-related dysmotility with incomplete clearance
of barium by primary peristaltic waves. Scattered secondary and
tertiary waves noted

Mucosa:  Smooth without irregularity or ulceration

Hypopharynx/cervical esophagus: No laryngeal penetration or
aspiration. Vallecular and piriform sinus residuals noted.

Hiatal hernia:  Absent

GE reflux:  Not witnessed during exam.

Other:  N/A
IMPRESSION: Esophageal dysmotility.

Vallecular and piriform sinus residuals without laryngeal
penetration or aspiration.

Remainder of exam unremarkable.
